# Patient Record
Sex: Female | Born: 1950 | Race: Black or African American | Hispanic: No | Marital: Married | Smoking: Current every day smoker
Health system: Southern US, Community
[De-identification: ages and names within clinical notes are randomized; demographics above are authoritative.]

## PROBLEM LIST (undated history)

## (undated) DIAGNOSIS — E785 Hyperlipidemia, unspecified: Secondary | ICD-10-CM

## (undated) DIAGNOSIS — C50919 Malignant neoplasm of unspecified site of unspecified female breast: Principal | ICD-10-CM

## (undated) DIAGNOSIS — E039 Hypothyroidism, unspecified: Secondary | ICD-10-CM

## (undated) DIAGNOSIS — M1990 Unspecified osteoarthritis, unspecified site: Secondary | ICD-10-CM

## (undated) DIAGNOSIS — Z972 Presence of dental prosthetic device (complete) (partial): Secondary | ICD-10-CM

## (undated) DIAGNOSIS — Z973 Presence of spectacles and contact lenses: Secondary | ICD-10-CM

## (undated) DIAGNOSIS — K08109 Complete loss of teeth, unspecified cause, unspecified class: Secondary | ICD-10-CM

## (undated) HISTORY — PX: TONSILLECTOMY: SUR1361

## (undated) HISTORY — DX: Malignant neoplasm of unspecified site of unspecified female breast: C50.919

---

## 1979-12-04 HISTORY — PX: ABDOMINAL HYSTERECTOMY: SHX81

## 1986-12-03 HISTORY — PX: APPENDECTOMY: SHX54

## 1988-12-03 HISTORY — PX: BREAST SURGERY: SHX581

## 1989-12-03 HISTORY — PX: BREAST CYST ASPIRATION: SHX578

## 2000-09-20 ENCOUNTER — Encounter: Payer: Self-pay | Admitting: Obstetrics

## 2000-09-20 ENCOUNTER — Ambulatory Visit (HOSPITAL_COMMUNITY): Admission: RE | Admit: 2000-09-20 | Discharge: 2000-09-20 | Payer: Self-pay | Admitting: Obstetrics

## 2003-08-29 ENCOUNTER — Encounter: Payer: Self-pay | Admitting: Emergency Medicine

## 2003-08-29 ENCOUNTER — Emergency Department (HOSPITAL_COMMUNITY): Admission: EM | Admit: 2003-08-29 | Discharge: 2003-08-29 | Payer: Self-pay

## 2003-09-29 ENCOUNTER — Encounter: Admission: RE | Admit: 2003-09-29 | Discharge: 2003-12-28 | Payer: Self-pay | Admitting: Orthopedic Surgery

## 2003-12-04 HISTORY — PX: SHOULDER ARTHROSCOPY: SHX128

## 2003-12-29 ENCOUNTER — Encounter: Admission: RE | Admit: 2003-12-29 | Discharge: 2004-03-28 | Payer: Self-pay | Admitting: Orthopedic Surgery

## 2004-03-18 ENCOUNTER — Ambulatory Visit (HOSPITAL_COMMUNITY): Admission: RE | Admit: 2004-03-18 | Discharge: 2004-03-18 | Payer: Self-pay | Admitting: Orthopedic Surgery

## 2004-03-29 ENCOUNTER — Encounter: Admission: RE | Admit: 2004-03-29 | Discharge: 2004-06-27 | Payer: Self-pay | Admitting: Orthopedic Surgery

## 2004-06-21 ENCOUNTER — Ambulatory Visit (HOSPITAL_COMMUNITY): Admission: RE | Admit: 2004-06-21 | Discharge: 2004-06-22 | Payer: Self-pay | Admitting: Orthopedic Surgery

## 2004-07-21 ENCOUNTER — Encounter: Admission: RE | Admit: 2004-07-21 | Discharge: 2004-10-19 | Payer: Self-pay | Admitting: Orthopedic Surgery

## 2004-10-20 ENCOUNTER — Encounter: Admission: RE | Admit: 2004-10-20 | Discharge: 2005-01-18 | Payer: Self-pay | Admitting: Orthopedic Surgery

## 2006-08-01 ENCOUNTER — Ambulatory Visit (HOSPITAL_COMMUNITY): Admission: RE | Admit: 2006-08-01 | Discharge: 2006-08-01 | Payer: Self-pay | Admitting: Pulmonary Disease

## 2006-12-03 HISTORY — PX: TOE FUSION: SHX1070

## 2007-07-07 ENCOUNTER — Encounter: Admission: RE | Admit: 2007-07-07 | Discharge: 2007-07-07 | Payer: Self-pay | Admitting: Orthopedic Surgery

## 2007-07-10 ENCOUNTER — Ambulatory Visit (HOSPITAL_COMMUNITY): Admission: RE | Admit: 2007-07-10 | Discharge: 2007-07-10 | Payer: Self-pay | Admitting: Orthopedic Surgery

## 2007-09-24 ENCOUNTER — Ambulatory Visit (HOSPITAL_COMMUNITY): Admission: RE | Admit: 2007-09-24 | Discharge: 2007-09-24 | Payer: Self-pay | Admitting: Pulmonary Disease

## 2009-12-03 HISTORY — PX: BREAST SURGERY: SHX581

## 2010-04-26 ENCOUNTER — Encounter: Admission: RE | Admit: 2010-04-26 | Discharge: 2010-04-26 | Payer: Self-pay | Admitting: Pulmonary Disease

## 2010-04-27 ENCOUNTER — Encounter: Admission: RE | Admit: 2010-04-27 | Discharge: 2010-04-27 | Payer: Self-pay | Admitting: Pulmonary Disease

## 2010-05-11 ENCOUNTER — Encounter: Admission: RE | Admit: 2010-05-11 | Discharge: 2010-05-11 | Payer: Self-pay | Admitting: Pulmonary Disease

## 2010-06-26 ENCOUNTER — Encounter: Admission: RE | Admit: 2010-06-26 | Discharge: 2010-06-26 | Payer: Self-pay | Admitting: Surgery

## 2010-06-28 ENCOUNTER — Ambulatory Visit (HOSPITAL_BASED_OUTPATIENT_CLINIC_OR_DEPARTMENT_OTHER): Admission: RE | Admit: 2010-06-28 | Discharge: 2010-06-28 | Payer: Self-pay | Admitting: Surgery

## 2010-06-28 ENCOUNTER — Encounter: Admission: RE | Admit: 2010-06-28 | Discharge: 2010-06-28 | Payer: Self-pay | Admitting: Surgery

## 2010-07-03 ENCOUNTER — Ambulatory Visit: Payer: Self-pay | Admitting: Oncology

## 2010-08-18 ENCOUNTER — Ambulatory Visit: Admission: RE | Admit: 2010-08-18 | Discharge: 2010-08-29 | Payer: Self-pay | Admitting: Radiation Oncology

## 2010-08-31 ENCOUNTER — Ambulatory Visit (HOSPITAL_BASED_OUTPATIENT_CLINIC_OR_DEPARTMENT_OTHER): Admission: RE | Admit: 2010-08-31 | Discharge: 2010-08-31 | Payer: Self-pay | Admitting: Surgery

## 2010-09-23 HISTORY — PX: MASTECTOMY: SHX3

## 2010-09-28 ENCOUNTER — Ambulatory Visit: Payer: Self-pay | Admitting: Oncology

## 2010-10-02 LAB — CBC WITH DIFFERENTIAL/PLATELET
BASO%: 0.6 % (ref 0.0–2.0)
Basophils Absolute: 0.1 10*3/uL (ref 0.0–0.1)
EOS%: 4.8 % (ref 0.0–7.0)
Eosinophils Absolute: 0.5 10*3/uL (ref 0.0–0.5)
HCT: 36.7 % (ref 34.8–46.6)
HGB: 12.1 g/dL (ref 11.6–15.9)
LYMPH%: 49.4 % (ref 14.0–49.7)
MCH: 30.2 pg (ref 25.1–34.0)
MCHC: 33 g/dL (ref 31.5–36.0)
MCV: 91.5 fL (ref 79.5–101.0)
MONO#: 0.9 10*3/uL (ref 0.1–0.9)
MONO%: 8.7 % (ref 0.0–14.0)
NEUT#: 3.8 10*3/uL (ref 1.5–6.5)
NEUT%: 36.5 % — ABNORMAL LOW (ref 38.4–76.8)
Platelets: 272 10*3/uL (ref 145–400)
RBC: 4.01 10*6/uL (ref 3.70–5.45)
RDW: 13.9 % (ref 11.2–14.5)
WBC: 10.5 10*3/uL — ABNORMAL HIGH (ref 3.9–10.3)
lymph#: 5.2 10*3/uL — ABNORMAL HIGH (ref 0.9–3.3)
nRBC: 0 % (ref 0–0)

## 2010-10-02 LAB — TECHNOLOGIST REVIEW

## 2010-11-29 ENCOUNTER — Ambulatory Visit: Payer: Self-pay | Admitting: Oncology

## 2010-12-03 HISTORY — PX: BREAST RECONSTRUCTION: SHX9

## 2010-12-05 ENCOUNTER — Encounter
Admission: RE | Admit: 2010-12-05 | Discharge: 2010-12-05 | Payer: Self-pay | Source: Home / Self Care | Attending: Oncology | Admitting: Oncology

## 2010-12-05 LAB — CBC WITH DIFFERENTIAL/PLATELET
BASO%: 0.2 % (ref 0.0–2.0)
Eosinophils Absolute: 0.2 10*3/uL (ref 0.0–0.5)
HCT: 34.9 % (ref 34.8–46.6)
LYMPH%: 45.6 % (ref 14.0–49.7)
MCH: 29.8 pg (ref 25.1–34.0)
MCHC: 33 g/dL (ref 31.5–36.0)
MCV: 90.4 fL (ref 79.5–101.0)
Platelets: 295 10*3/uL (ref 145–400)
lymph#: 3.9 10*3/uL — ABNORMAL HIGH (ref 0.9–3.3)

## 2010-12-05 LAB — FOLLICLE STIMULATING HORMONE: FSH: 56.2 m[IU]/mL

## 2010-12-05 LAB — COMPREHENSIVE METABOLIC PANEL
ALT: 13 U/L (ref 0–35)
AST: 19 U/L (ref 0–37)
Albumin: 4.1 g/dL (ref 3.5–5.2)
Alkaline Phosphatase: 114 U/L (ref 39–117)
BUN: 7 mg/dL (ref 6–23)
CO2: 25 mEq/L (ref 19–32)
Calcium: 10.2 mg/dL (ref 8.4–10.5)
Chloride: 106 mEq/L (ref 96–112)
Creatinine, Ser: 0.72 mg/dL (ref 0.40–1.20)
Glucose, Bld: 103 mg/dL — ABNORMAL HIGH (ref 70–99)
Potassium: 4 mEq/L (ref 3.5–5.3)
Sodium: 143 mEq/L (ref 135–145)
Total Bilirubin: 0.3 mg/dL (ref 0.3–1.2)
Total Protein: 6.6 g/dL (ref 6.0–8.3)

## 2010-12-05 LAB — LUTEINIZING HORMONE: LH: 26.5 m[IU]/mL

## 2010-12-10 LAB — ESTRADIOL, ULTRA SENS: Estradiol, Ultra Sensitive: 2 pg/mL

## 2010-12-12 IMAGING — CR DG CHEST 2V
2 series · 2 of 2 positions shown · non-contrast
Comparison: Chest x-ray of 07/08/2007

CLINICAL DATA: Out for right breast surgery, smoking history

CHEST - 2 VIEW

[w chest pa]
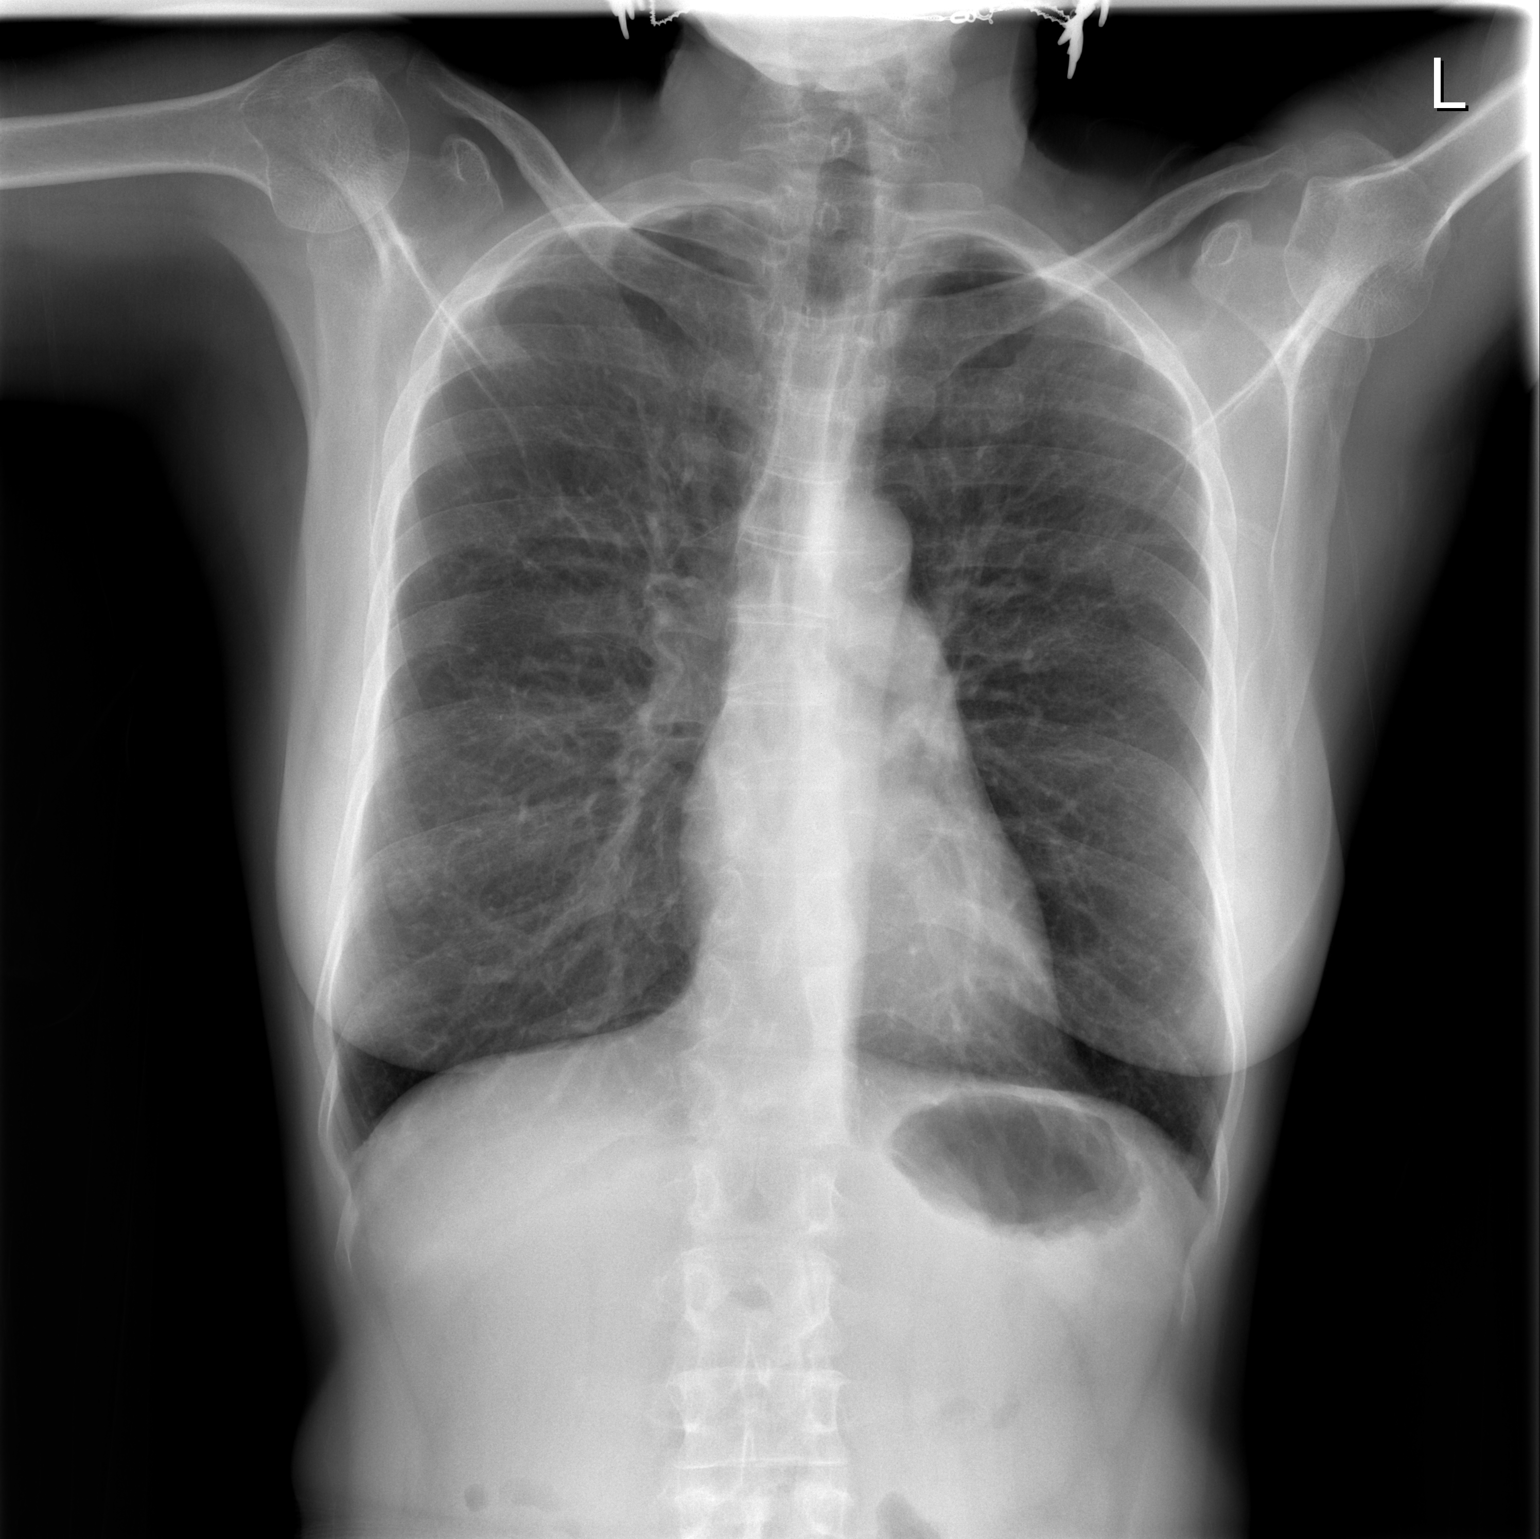

[w chest lat]
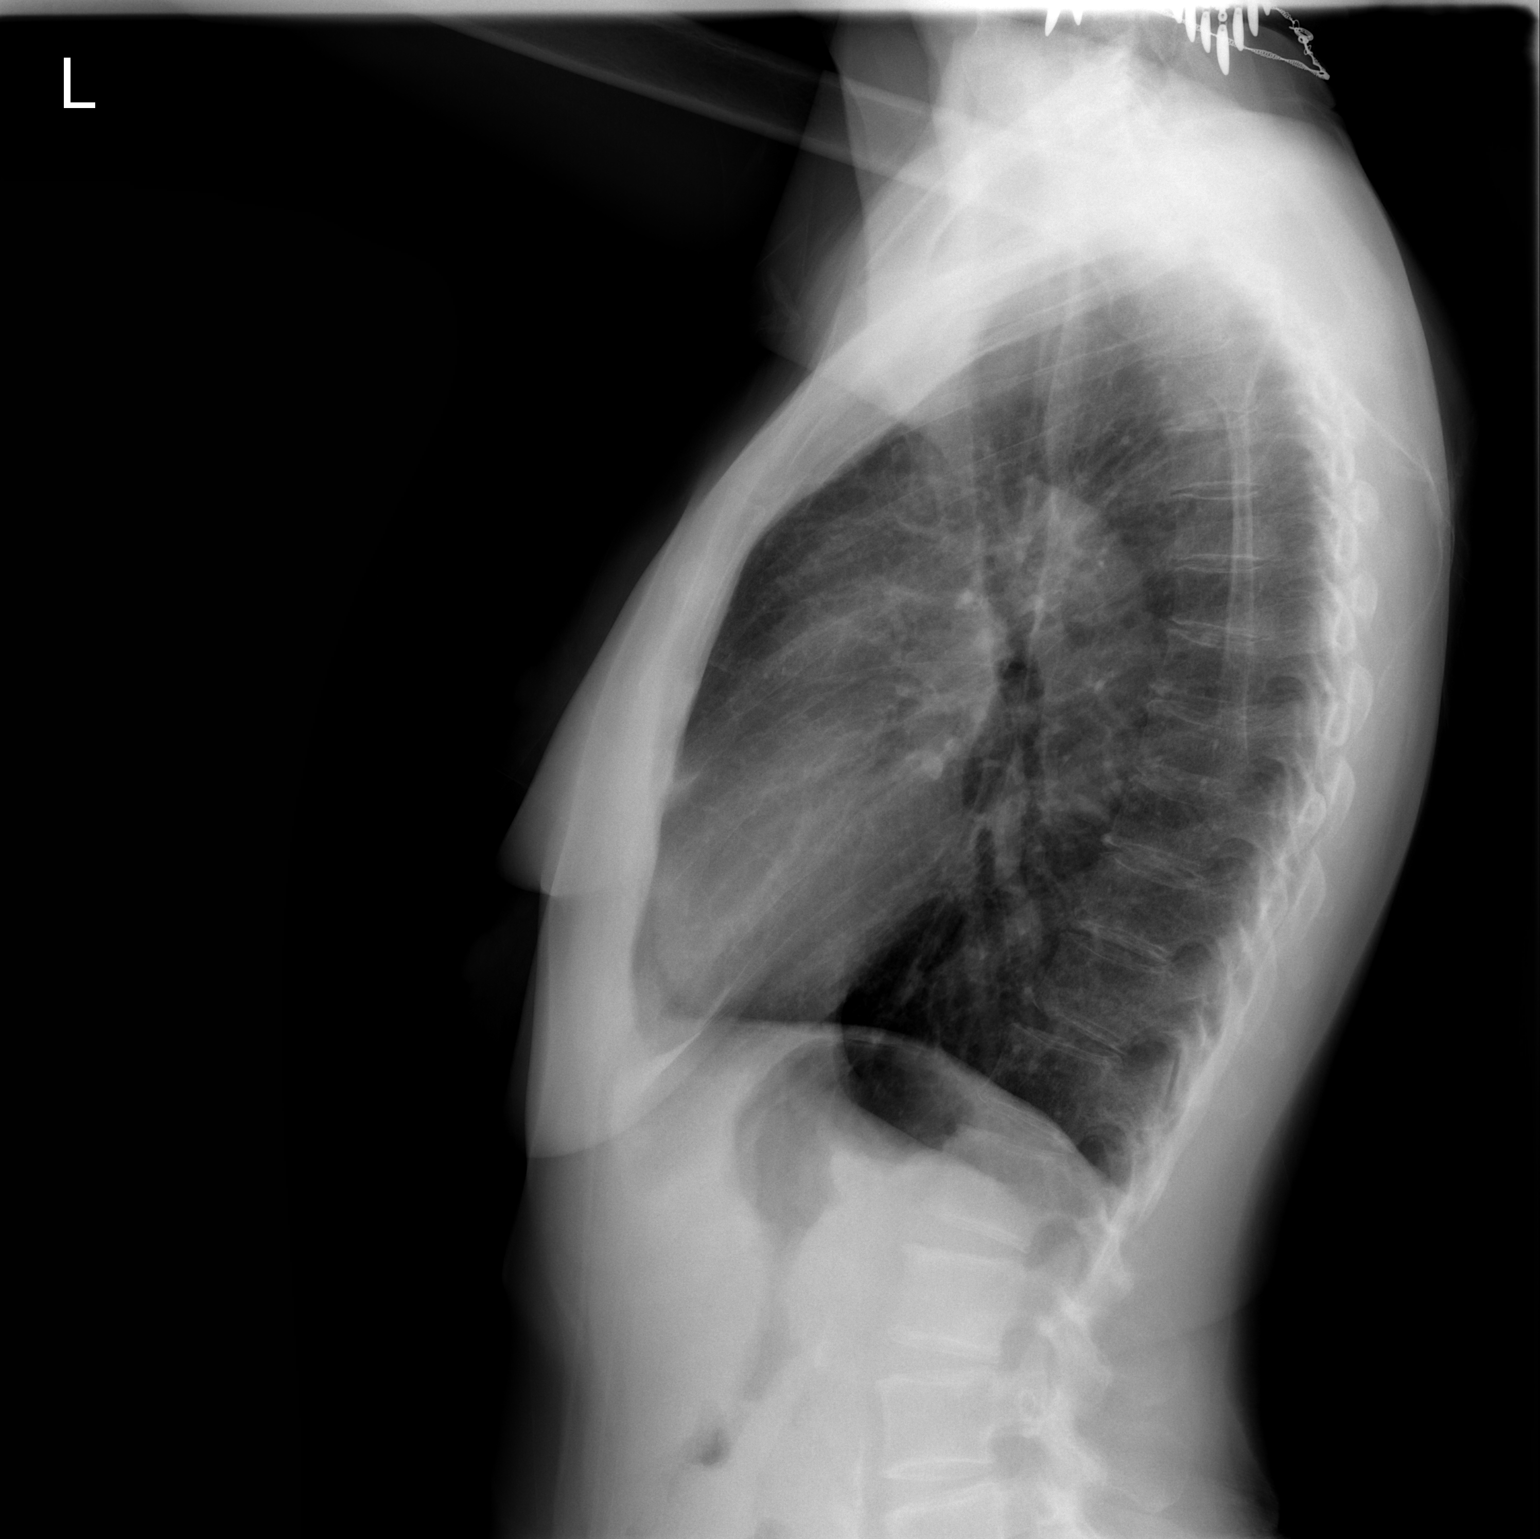

[2 of 2 positions shown; findings below may reference images not displayed]

FINDINGS: The lungs are clear and slightly hyperaerated.
Mediastinal contours appear stable.  Slightly prominent
interstitial markings appear stable.  The heart is within normal
limits in size.  No bony abnormality is seen.
IMPRESSION: Slight hyperaeration and probable chronic mild interstitial change.
No active lung disease.

## 2010-12-24 ENCOUNTER — Encounter: Payer: Self-pay | Admitting: Pulmonary Disease

## 2011-02-17 LAB — DIFFERENTIAL
Basophils Absolute: 0 10*3/uL (ref 0.0–0.1)
Basophils Relative: 0 % (ref 0–1)
Eosinophils Absolute: 0.2 10*3/uL (ref 0.0–0.7)
Eosinophils Relative: 2 % (ref 0–5)
Lymphocytes Relative: 33 % (ref 12–46)
Neutro Abs: 6.6 10*3/uL (ref 1.7–7.7)

## 2011-02-17 LAB — CBC
MCV: 95.2 fL (ref 78.0–100.0)
Platelets: 336 10*3/uL (ref 150–400)
RBC: 4.17 MIL/uL (ref 3.87–5.11)
WBC: 11.4 10*3/uL — ABNORMAL HIGH (ref 4.0–10.5)

## 2011-02-17 LAB — BASIC METABOLIC PANEL
BUN: 6 mg/dL (ref 6–23)
GFR calc Af Amer: >60 mL/min (ref >60)
Sodium: 141 mEq/L (ref 135–145)

## 2011-04-17 NOTE — Op Note (Signed)
NAMEKYNLEY, METZGER NO.:  [id number]   MEDICAL RECORD NO.:  [id number]          PATIENT TYPE:  AMB   LOCATION:  SDS                          FACILITY:  [facility]   PHYSICIAN:  Myrtie Neither, MD      DATE OF BIRTH:  [birth year]   DATE OF PROCEDURE:  07/10/2007  DATE OF DISCHARGE:                               OPERATIVE REPORT   PREOPERATIVE DIAGNOSIS:  Dorsal callosity osteophyte right fifth toe.   POSTOPERATIVE DIAGNOSIS:  Dorsal callosity osteophyte right fifth toe.   ANESTHESIA:  Ankle block.   PROCEDURE:  Phalangectomy, right fifth toe.   The patient was taken to the operating room after given adequate preop  medication.  The patient was given a right ankle block.  Right foot and  lower leg was prepped with DuraPrep and draped in a sterile manner.  Tourniquet used for hemostasis.  Dorsal incision made over the right  fifth toe going through the skin and subcutaneous tissue.  Sharp and  blunt dissection was made down over the proximal phalanx.  Bone cutter  was used to dissect the proximal phalanx of the fifth toe.  After  complete resection, irrigation was then done.  Wound closure was then  done with 4-0 nylon.  Bulky compressive dressing was applied.  The  patient tolerated procedure quite well and went to the recovery room in  stable and satisfactory position.  The patient is being discharged home  with use of fracture shoe, ice packs, elevation, crutches for partial  weightbearing on the right side and return to the office in one week.  The patient being discharged in stable and satisfactory condition.      Myrtie Neither, MD  Electronically Signed     AC/MEDQ  D:  07/10/2007  T:  07/10/2007  Job:  [id number]

## 2011-04-20 NOTE — H&P (Signed)
NAME:  Carrie Ramirez, Carrie Ramirez NO.:  [id number]   MEDICAL RECORD NO.:  [id number]                   PATIENT TYPE:  OIB   LOCATION:  2899                                 FACILITY:  [facility]   PHYSICIAN:  Myrtie Neither, M.D.                 DATE OF BIRTH:  Oct 08, 1951   DATE OF ADMISSION:  06/21/2004  DATE OF DISCHARGE:                                HISTORY & PHYSICAL   CHIEF COMPLAINT:  Painful right shoulder.   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  The patient had been treated with prednisone as well as  therapeutic injections to the shoulder, physical therapy.  The patient has  had improvement in range of motion of the right shoulder, but has pain on  abduction of the right shoulder and still persistent stiffness.  The patient  had MRI done demonstrating entrapment of the rotator cuff of the right  shoulder and rotator cuff injury.   PAST MEDICAL HISTORY:  1. Tonsillectomy.  2. D&C.  3. Hysterectomy.  4. Appendectomy.  5. Lithotripsy.  6. Shoulder-hand syndrome.  7. Sinus infection.  8. Hypoglycemia.  9. Hypothyroidism.   ALLERGIES:  1. SULFA.  2. ASPIRIN.   MEDICATIONS:  1. Prednisone 10 mg daily.  2. Cipro 500 mg b.i.d.  3. Synthroid 10 mEq daily.  4. Duratuss 60/120 1/2 tablet b.i.d.  5. Vitamin E daily.  6. Albuterol inhaler.  7. Lipitor 10 mg daily.   SOCIAL HISTORY:  The patient is a chronic smoker, 1/2 pack per day.  No use  of alcohol.   REVIEW OF SYSTEMS:  Basically, as in history of present illness.  Also,  productive cough for the past week.  Residual of shoulder-hand syndrome,  right upper extremity.  No urinary or bowel symptoms.   FAMILY HISTORY:  Noncontributory.   PHYSICAL EXAMINATION:  GENERAL:  Alert and oriented, in no  acute distress.  HEENT:  Head is normocephalic.  Eyes:  Sclerae clear.  NECK:  Supple.  CHEST:  Some rhonchi.  CARDIAC:  S1 and S2 regular.  EXTREMITIES:  Right shoulder ankylotic, tender subacromial space with  subacromial crepitus.  Passive abduction right shoulder 75 degrees,  limitation on external rotation to neutral position.  Right hand:  Good  grip, good pinch, intrinsic's intact the distance of the MCP and IP joint of  the fingers of the right hand.  Wrist range of motion:  The patient lacks  full dorsal flexion, supination and pronation are good.   IMPRESSION:  1. Impingement syndrome, right shoulder.  2. Rotator cuff injury, right shoulder.   PLAN:  Arthroscopic acromioplasty and possible rotator cuff repair.  Myrtie Neither, M.D.    AC/MEDQ  D:  06/21/2004  T:  06/21/2004  Job:  [id number]

## 2011-05-17 ENCOUNTER — Encounter (HOSPITAL_BASED_OUTPATIENT_CLINIC_OR_DEPARTMENT_OTHER)
Admission: RE | Admit: 2011-05-17 | Discharge: 2011-05-17 | Disposition: A | Discharge status: Home / Self Care | Payer: BC Managed Care – PPO | Source: Ambulatory Visit | Attending: Specialist | Admitting: Specialist

## 2011-05-17 LAB — CBC
Hemoglobin: 13.6 g/dL (ref 12.0–15.0)
MCH: 30.8 pg (ref 26.0–34.0)
MCHC: 34.4 g/dL (ref 30.0–36.0)
RDW: 13.6 % (ref 11.5–15.5)

## 2011-05-17 LAB — BASIC METABOLIC PANEL
BUN: 11 mg/dL (ref 6–23)
Calcium: 9.5 mg/dL (ref 8.4–10.5)
GFR calc Af Amer: >60 mL/min (ref >60)
GFR calc non Af Amer: >60 mL/min (ref >60)
Glucose, Bld: 81 mg/dL (ref 70–99)
Sodium: 140 mEq/L (ref 135–145)

## 2011-05-17 LAB — DIFFERENTIAL
Basophils Absolute: 0 10*3/uL (ref 0.0–0.1)
Basophils Relative: 0 % (ref 0–1)
Eosinophils Relative: 4 % (ref 0–5)

## 2011-05-21 ENCOUNTER — Ambulatory Visit (HOSPITAL_BASED_OUTPATIENT_CLINIC_OR_DEPARTMENT_OTHER)
Admission: RE | Admit: 2011-05-21 | Discharge: 2011-05-21 | Disposition: A | Discharge status: Home / Self Care | Payer: BC Managed Care – PPO | Source: Ambulatory Visit | Attending: Specialist | Admitting: Specialist

## 2011-05-21 ENCOUNTER — Other Ambulatory Visit: Payer: Self-pay | Admitting: Specialist

## 2011-05-21 DIAGNOSIS — F17200 Nicotine dependence, unspecified, uncomplicated: Secondary | ICD-10-CM | POA: Insufficient documentation

## 2011-05-21 DIAGNOSIS — E039 Hypothyroidism, unspecified: Secondary | ICD-10-CM | POA: Insufficient documentation

## 2011-05-21 DIAGNOSIS — Z79899 Other long term (current) drug therapy: Secondary | ICD-10-CM | POA: Insufficient documentation

## 2011-05-21 DIAGNOSIS — C50919 Malignant neoplasm of unspecified site of unspecified female breast: Secondary | ICD-10-CM | POA: Insufficient documentation

## 2011-05-21 DIAGNOSIS — Z01812 Encounter for preprocedural laboratory examination: Secondary | ICD-10-CM | POA: Insufficient documentation

## 2011-05-21 DIAGNOSIS — N651 Disproportion of reconstructed breast: Secondary | ICD-10-CM | POA: Insufficient documentation

## 2011-05-21 DIAGNOSIS — L905 Scar conditions and fibrosis of skin: Secondary | ICD-10-CM | POA: Insufficient documentation

## 2011-06-19 ENCOUNTER — Other Ambulatory Visit: Payer: Self-pay | Admitting: Oncology

## 2011-06-19 ENCOUNTER — Encounter (HOSPITAL_BASED_OUTPATIENT_CLINIC_OR_DEPARTMENT_OTHER): Payer: BC Managed Care – PPO | Admitting: Oncology

## 2011-06-19 DIAGNOSIS — Z170 Estrogen receptor positive status [ER+]: Secondary | ICD-10-CM

## 2011-06-19 DIAGNOSIS — C50919 Malignant neoplasm of unspecified site of unspecified female breast: Secondary | ICD-10-CM

## 2011-06-19 DIAGNOSIS — C50219 Malignant neoplasm of upper-inner quadrant of unspecified female breast: Secondary | ICD-10-CM

## 2011-06-19 LAB — COMPREHENSIVE METABOLIC PANEL
ALT: 12 U/L (ref 0–35)
AST: 21 U/L (ref 0–37)
Albumin: 3.7 g/dL (ref 3.5–5.2)
Alkaline Phosphatase: 100 U/L (ref 39–117)
Potassium: 4 mEq/L (ref 3.5–5.3)
Sodium: 142 mEq/L (ref 135–145)
Total Protein: 7 g/dL (ref 6.0–8.3)

## 2011-06-19 LAB — CBC WITH DIFFERENTIAL/PLATELET
EOS%: 4.4 % (ref 0.0–7.0)
MCH: 30.9 pg (ref 25.1–34.0)
MCV: 91.6 fL (ref 79.5–101.0)
MONO%: 8.7 % (ref 0.0–14.0)
NEUT#: 4.4 10*3/uL (ref 1.5–6.5)
RBC: 3.97 10*6/uL (ref 3.70–5.45)
RDW: 14.1 % (ref 11.2–14.5)

## 2011-06-26 ENCOUNTER — Encounter (HOSPITAL_BASED_OUTPATIENT_CLINIC_OR_DEPARTMENT_OTHER): Payer: BC Managed Care – PPO | Admitting: Oncology

## 2011-06-26 DIAGNOSIS — C50219 Malignant neoplasm of upper-inner quadrant of unspecified female breast: Secondary | ICD-10-CM

## 2011-06-26 DIAGNOSIS — Z170 Estrogen receptor positive status [ER+]: Secondary | ICD-10-CM

## 2011-08-04 HISTORY — PX: AUGMENTATION MAMMAPLASTY: SUR837

## 2011-09-17 LAB — BASIC METABOLIC PANEL
CO2: 30
Calcium: 10
Creatinine, Ser: 0.74
Glucose, Bld: 73

## 2011-09-17 LAB — URINALYSIS, ROUTINE W REFLEX MICROSCOPIC
Glucose, UA: NEGATIVE
Nitrite: NEGATIVE
Protein, ur: NEGATIVE
Urobilinogen, UA: 1

## 2011-09-17 LAB — CBC
Hemoglobin: 13.6
MCHC: 33.5
RDW: 13.3

## 2011-09-17 LAB — HEPATIC FUNCTION PANEL
Albumin: 3.9
Total Bilirubin: 0.5
Total Protein: 6.6

## 2011-09-17 LAB — URINE MICROSCOPIC-ADD ON

## 2011-12-20 ENCOUNTER — Other Ambulatory Visit: Payer: BC Managed Care – PPO | Admitting: Lab

## 2011-12-25 ENCOUNTER — Other Ambulatory Visit: Payer: BC Managed Care – PPO | Admitting: Lab

## 2011-12-25 DIAGNOSIS — C50219 Malignant neoplasm of upper-inner quadrant of unspecified female breast: Secondary | ICD-10-CM | POA: Diagnosis not present

## 2011-12-25 LAB — CBC WITH DIFFERENTIAL/PLATELET
BASO%: 0.6 % (ref 0.0–2.0)
EOS%: 4.1 % (ref 0.0–7.0)
HCT: 38.4 % (ref 34.8–46.6)
LYMPH%: 50.9 % — ABNORMAL HIGH (ref 14.0–49.7)
MCH: 31.5 pg (ref 25.1–34.0)
MCHC: 33.7 g/dL (ref 31.5–36.0)
MCV: 93.6 fL (ref 79.5–101.0)
MONO%: 6.6 % (ref 0.0–14.0)
NEUT%: 37.8 % — ABNORMAL LOW (ref 38.4–76.8)
Platelets: 226 10*3/uL (ref 145–400)

## 2011-12-26 LAB — COMPREHENSIVE METABOLIC PANEL
ALT: 11 U/L (ref 0–35)
AST: 25 U/L (ref 0–37)
BUN: 9 mg/dL (ref 6–23)
Creatinine, Ser: 0.84 mg/dL (ref 0.50–1.10)
Total Bilirubin: 0.4 mg/dL (ref 0.3–1.2)

## 2011-12-27 ENCOUNTER — Ambulatory Visit: Payer: BC Managed Care – PPO | Admitting: Oncology

## 2011-12-28 ENCOUNTER — Encounter: Payer: Self-pay | Admitting: Physician Assistant

## 2011-12-28 ENCOUNTER — Ambulatory Visit: Payer: BC Managed Care – PPO | Admitting: Physician Assistant

## 2011-12-28 DIAGNOSIS — C50919 Malignant neoplasm of unspecified site of unspecified female breast: Secondary | ICD-10-CM

## 2011-12-28 HISTORY — DX: Malignant neoplasm of unspecified site of unspecified female breast: C50.919

## 2011-12-28 NOTE — Progress Notes (Signed)
Patient has requested today's appointment, 12/28/2011, be canceled due to inclement weather. This has been rescheduled for January 30 at 1:15.

## 2012-01-02 ENCOUNTER — Encounter (HOSPITAL_BASED_OUTPATIENT_CLINIC_OR_DEPARTMENT_OTHER): Payer: Self-pay | Admitting: *Deleted

## 2012-01-02 ENCOUNTER — Encounter: Payer: Self-pay | Admitting: Physician Assistant

## 2012-01-02 ENCOUNTER — Ambulatory Visit (HOSPITAL_BASED_OUTPATIENT_CLINIC_OR_DEPARTMENT_OTHER): Payer: BC Managed Care – PPO | Admitting: Physician Assistant

## 2012-01-02 ENCOUNTER — Ambulatory Visit: Payer: BC Managed Care – PPO | Admitting: Oncology

## 2012-01-02 VITALS — BP 158/65 | HR 118 | Temp 98.6°F | Wt 106.3 lb

## 2012-01-02 DIAGNOSIS — C50919 Malignant neoplasm of unspecified site of unspecified female breast: Secondary | ICD-10-CM | POA: Diagnosis not present

## 2012-01-02 DIAGNOSIS — M949 Disorder of cartilage, unspecified: Secondary | ICD-10-CM

## 2012-01-02 DIAGNOSIS — M899 Disorder of bone, unspecified: Secondary | ICD-10-CM | POA: Diagnosis not present

## 2012-01-02 DIAGNOSIS — C50419 Malignant neoplasm of upper-outer quadrant of unspecified female breast: Secondary | ICD-10-CM | POA: Diagnosis not present

## 2012-01-02 DIAGNOSIS — M8580 Other specified disorders of bone density and structure, unspecified site: Secondary | ICD-10-CM | POA: Insufficient documentation

## 2012-01-02 MED ORDER — ALENDRONATE SODIUM 70 MG PO TABS: 70.0000 mg | ORAL_TABLET | ORAL | Status: DC

## 2012-01-02 MED ORDER — LETROZOLE 2.5 MG PO TABS: 2.5000 mg | ORAL_TABLET | Freq: Every day | ORAL | Status: DC

## 2012-01-02 NOTE — Progress Notes (Signed)
Hematology and Oncology Follow Up Visit  Carrie Ramirez [id number] 1951-02-11 60 y.o. 01/02/2012    HPI: Carrie Ramirez palpated a mass in her right breast on Mother's Day.  She notes that she missed her mammogram last year because her husband had some surgery and in fact the last mammogram I can find for her would be from October of 2008.  At any rate she brought the mass to Dr. Consuello Bossier attention and he set her up for bilateral diagnostic mammography performed Apr 26, 2010.  This found pleomorphic and linear classifications in the upper inner right breast, measuring up to 6.5 cm.  The posterior extent of the calcifications were outside of the field of view.  No discrete mass was seen, although on physical exam Dr. Si Gaul was able to palpate some thickening in the area in question.  Ultrasound showed an ill-defined hypoechoic areas at the corresponding portion of the breast.  There were no enlarged or abnormal appearing right axillary lymph nodes identified.  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  With this information the patient was referred to Dr. Luisa Hart and bilateral breast MRIs were obtained June 9.  This showed an area of asymmetric patchy enhancement in the right breast surrounding the clip artifact and measuring approximately 2.3 cm.  There was no abnormal enhancement in the left breast and no enlarged axillary or internal mammary lymph nodes.  Accordingly, after appropriate discussion, the patient proceeded to needle localized lumpectomy without sentinel lymph node sampling June 28, 2010.  The pathology from this procedure ([id number]) showed in addition to extensive high grade ductal carcinoma in situ, multiple foci of invasive ductal carcinoma, grade 2.  The largest focus measured 8 mm.  The anterior and superior margins were  involved with DCIS.  The distance to the invasive component was less than a millimeter anteriorly.  She underwent modified radical mastectomy September 29th under Dr. Luisa Hart.  The final pathology [id number]) showed no residual invasive tumor in the breast (there was a single focus of high-grade ductal carcinoma in situ with ample margins), but the tumor did involve both sentinel lymph nodes.  An additional 15 lymph nodes were negative, giving her a total of 17 lymph nodes of which 2 were positive.    The Adjuvant! Program would quote her a risk of recurrence with local treatment only of 35%. She could cut this in half by taking antiestrogens for 5-10 years which was recommended.  She could cut this down an additional 4% by taking 5 months of aggressive chemotherapy.  This was not a motivator for her, since she understands 96% of women like her would get no benefit from the very toxic chemotherapy drugs.    Accordingly, the patient began on letrozole adjuvantly in October of 2011. She continues to 2.5 mg daily. Also has a history of osteopenia and is on Fosamax weekly.  Interim History:   Carrie Ramirez returns today accompanied by her husband for routine followup of her right breast carcinoma. Interval history is remarkable for her right implant having a "leak". This was evaluated by Dr. Shon Hough last week, and in fact she scheduled for surgery to correct this issue next Monday, February 4.  Otherwise,  Carrie Ramirez has been doing very well and had few complaints today. She continues on both letrozole and Fosamax and tolerates both drugs well. She has no increased reflux.  No problems with hot flashes. No increased joint pain. No problems with vaginal dryness.  A detailed review of systems is otherwise noncontributory as noted below.  Review of Systems: Constitutional:  no weight loss, fever, night sweats and feels well Eyes: negative WUJ:[id number] Cardiovascular: no chest pain or dyspnea on  exertion Respiratory: no cough, shortness of breath, or wheezing Neurological: no TIA or stroke symptoms negative Dermatological: negative Gastrointestinal: no abdominal pain, change in bowel habits, or black or bloody stools Genito-Urinary: no dysuria, trouble voiding, or hematuria Hematological and Lymphatic: negative Breast: positive for - "leaking" implant on right Musculoskeletal: negative Remaining ROS negative.  PAST MEDICAL HISTORY:   1.  History of ongoing tobacco abuse approximately half a pack per day. 2.  Remote history of peptic ulcer disease. 3.  Hypothyroidism. 4.  History of right foot surgery. 5.  History of right shoulder rotator cuff surgery under Dr. Montez Morita. 6.  Status post tonsillectomy and adenoidectomy. 7.  Status post appendectomy. 8.  Status post simple hysterectomy.  FAMILY HISTORY:  The patient's father died in his early 42s from complications of diabetes.  The patient's mother died at the age of 82.  She had a diagnosis of mesothelioma.  The patient has one sister who is in good health.  There is no history of breast or ovarian cancer in the immediate family although a cousin on the patient's father side did have breast cancer.  GYNECOLOGIC HISTORY:  She is Gx, P3.  As stated she had her hysterectomy in 1981.  She took Premarin after that for several years and more recently she has been on the estrogen patch.  SOCIAL HISTORY:  She used to work in [employer] at [employer], but is currently disabled because of nerve issues relating to her right arm.  These were only partially corrected by the rotator cuff surgery she says.  Her husband of 31 years, Scotty Pinder (goes by "[name]") is Herbalist at [employer].  The patient's daughter [name] is an associate at [employer].  Daughter [name] 30 is a Tree surgeon, son [name] is attending cooking school hoping to become a Investment banker, operational.  The patient has six grandchildren and is also raising two nephews.   She is a member of the new [employer].  Medications:   I have reviewed the patient's current medications.  Current Outpatient Prescriptions  Medication Sig Dispense Refill  . acetaminophen (TYLENOL) 325 MG tablet Take 650 mg by mouth every 6 (six) hours as needed.      Marland Kitchen alendronate (FOSAMAX) 70 MG tablet Take 1 tablet (70 mg total) by mouth every 7 (seven) days. Take with a full glass of water on an empty stomach.  4 tablet  12  . calcium carbonate (OS-CAL) 600 MG TABS Take 600 mg by mouth 2 (two) times daily with a meal.      . cholecalciferol (VITAMIN D) 400 UNITS TABS Take 1,000 Units by mouth daily.      Marland Kitchen letrozole (FEMARA) 2.5 MG tablet Take 1 tablet (2.5 mg total) by mouth daily.  30 tablet  12  . levothyroxine (SYNTHROID, LEVOTHROID) 112 MCG tablet Take 112 mcg by mouth daily.      . polycarbophil (FIBERCON) 625 MG tablet Take 625 mg by mouth daily.      . rosuvastatin (CRESTOR) 10 MG tablet Take 10 mg by mouth daily.        Allergies:  Allergies  Allergen Reactions  . Asa Arthritis Strength-Antacid (Aspirin Buffered) Nausea Only  .  Sulfa Antibiotics Nausea Only    Physical Exam: Filed Vitals:   01/02/12 1347  BP: 158/65  Pulse: 118  Temp: 98.6 F (37 C)   HEENT:  Sclerae anicteric, conjunctivae pink.  Oropharynx clear.  No mucositis or candidiasis.   Nodes:  No cervical, supraclavicular, or axillary lymphadenopathy palpated.  Breast Exam:  Patient is status post right mastectomy with implant reconstruction, implant appears "deflated" today. No suspicious nodularity, skin changes, or evidence of local recurrence of disease. Left breast status post mastopexy, well-healed incisions, with no nodularity, skin changes, or nipple inversion.   Lungs:  Clear to auscultation bilaterally.  No crackles, rhonchi, or wheezes.   Heart:  Regular rate and rhythm.   Abdomen:  Soft, nontender.  Positive bowel sounds.  No organomegaly or masses palpated.   Musculoskeletal:   No focal spinal tenderness to palpation.  Extremities:  Benign.  No peripheral edema or cyanosis.   Skin:  Benign.   Neuro:  Nonfocal.   Lab Results: Lab Results  Component Value Date   WBC 9.1 12/25/2011   HGB 12.9 12/25/2011   HCT 38.4 12/25/2011   MCV 93.6 12/25/2011   PLT 226 12/25/2011   NEUTROABS 3.5 12/25/2011     Chemistry      Component Value Date/Time   NA 143 12/25/2011 1429   K 4.3 12/25/2011 1429   CL 107 12/25/2011 1429   CO2 25 12/25/2011 1429   BUN 9 12/25/2011 1429   CREATININE 0.84 12/25/2011 1429      Component Value Date/Time   CALCIUM 9.5 12/25/2011 1429   ALKPHOS 87 12/25/2011 1429   AST 25 12/25/2011 1429   ALT 11 12/25/2011 1429   BILITOT 0.4 12/25/2011 1429        Radiological Studies:  Most recent bone density was in January 2012 showing severe osteopenia. Patient is due for her left mammogram.   Assessment:  A 61 year old [place] woman   (1)  status post right lumpectomy in July 2011 for multifocal invasive ductal carcinoma, grade 2, the largest focus measuring 8 mm.  Margins were positive for ductal carcinoma in situ, invasive tumor was estrogen receptor positive at 96%, progesterone receptor positive at 93%, HER2/neu negative, with MIB-1 of 13%.    (2)  Status post right modified radical mastectomy, now with implant reconstruction.  Mastectomy was in September 2011 and showed no residual invasive disease, 2 of 17 lymph nodes involved, for a final stage of T1b N1, or stage IIA.  Oncotype DX recurrence score of 18.   (3)   Began on letrozole adjuvantly in October of 2011 and continues at 2.5 mg daily.    (4)  Also with a history of osteopenia, on Fosamax weekly.   Plan:  With regards to her breast cancer, Carrie Ramirez is doing well, with no clinical evidence of disease recurrence today. She'll have her implant affected by Dr. Shon Hough as scheduled next week. We're going to go ahead and schedule her for her left diagnostic mammogram which is a little past  due. She will return to see [place] in 6 months, and in the meanwhile will continue on letrozole which she is tolerating well. She'll also continue on Fosamax. I have refilled both of these medications accordingly.  This plan was reviewed with the patient, who voices understanding and agreement.  She knows to call with any changes or problems.    Carrie Steinmetz, PA-C 01/02/2012

## 2012-01-03 ENCOUNTER — Encounter (HOSPITAL_BASED_OUTPATIENT_CLINIC_OR_DEPARTMENT_OTHER): Payer: Self-pay | Admitting: *Deleted

## 2012-01-03 ENCOUNTER — Telehealth: Payer: Self-pay | Admitting: Oncology

## 2012-01-03 NOTE — Telephone Encounter (Signed)
S/w the pt regarding her mammo appt and her march appts

## 2012-01-03 NOTE — Progress Notes (Signed)
No labs ordered by dr Margarette Asal needed per anesth Has been here 3x

## 2012-01-07 ENCOUNTER — Ambulatory Visit (HOSPITAL_BASED_OUTPATIENT_CLINIC_OR_DEPARTMENT_OTHER)
Admission: RE | Admit: 2012-01-07 | Discharge: 2012-01-07 | Disposition: A | Discharge status: Home / Self Care | Payer: BC Managed Care – PPO | Source: Ambulatory Visit | Attending: Specialist | Admitting: Specialist

## 2012-01-07 ENCOUNTER — Encounter (HOSPITAL_BASED_OUTPATIENT_CLINIC_OR_DEPARTMENT_OTHER)
Admission: RE | Disposition: A | Discharge status: Home / Self Care | Payer: Self-pay | Source: Ambulatory Visit | Attending: Specialist

## 2012-01-07 ENCOUNTER — Other Ambulatory Visit: Payer: Self-pay | Admitting: Specialist

## 2012-01-07 ENCOUNTER — Encounter (HOSPITAL_BASED_OUTPATIENT_CLINIC_OR_DEPARTMENT_OTHER): Payer: Self-pay | Admitting: Anesthesiology

## 2012-01-07 ENCOUNTER — Encounter (HOSPITAL_BASED_OUTPATIENT_CLINIC_OR_DEPARTMENT_OTHER): Payer: Self-pay | Admitting: *Deleted

## 2012-01-07 ENCOUNTER — Ambulatory Visit (HOSPITAL_BASED_OUTPATIENT_CLINIC_OR_DEPARTMENT_OTHER): Payer: BC Managed Care – PPO | Admitting: Anesthesiology

## 2012-01-07 DIAGNOSIS — J45909 Unspecified asthma, uncomplicated: Secondary | ICD-10-CM | POA: Insufficient documentation

## 2012-01-07 DIAGNOSIS — T85898A Other specified complication of other internal prosthetic devices, implants and grafts, initial encounter: Secondary | ICD-10-CM | POA: Diagnosis not present

## 2012-01-07 DIAGNOSIS — T8549XA Other mechanical complication of breast prosthesis and implant, initial encounter: Secondary | ICD-10-CM | POA: Diagnosis not present

## 2012-01-07 DIAGNOSIS — Z4430 Encounter for fitting and adjustment of external breast prosthesis, unspecified breast: Secondary | ICD-10-CM | POA: Insufficient documentation

## 2012-01-07 DIAGNOSIS — C50219 Malignant neoplasm of upper-inner quadrant of unspecified female breast: Secondary | ICD-10-CM | POA: Diagnosis not present

## 2012-01-07 HISTORY — DX: Unspecified osteoarthritis, unspecified site: M19.90

## 2012-01-07 HISTORY — DX: Hypothyroidism, unspecified: E03.9

## 2012-01-07 HISTORY — PX: BREAST RECONSTRUCTION: SHX9

## 2012-01-07 HISTORY — DX: Hyperlipidemia, unspecified: E78.5

## 2012-01-07 LAB — POCT HEMOGLOBIN-HEMACUE: Hemoglobin: 12.9 g/dL (ref 12.0–15.0)

## 2012-01-07 SURGERY — RECONSTRUCTION, BREAST
Anesthesia: General | Site: Breast | Laterality: Right | Wound class: Clean

## 2012-01-07 MED ORDER — MIDAZOLAM HCL 2 MG/2ML IJ SOLN: 0.5000 mg | INTRAMUSCULAR | Status: DC | PRN

## 2012-01-07 MED ORDER — MIDAZOLAM HCL 5 MG/5ML IJ SOLN
INTRAMUSCULAR | Status: DC | PRN
  Administered 2012-01-07: 2 mg via INTRAVENOUS

## 2012-01-07 MED ORDER — LIDOCAINE HCL (CARDIAC) 20 MG/ML IV SOLN
INTRAVENOUS | Status: DC | PRN
  Administered 2012-01-07: 50 mg via INTRAVENOUS

## 2012-01-07 MED ORDER — LIDOCAINE-EPINEPHRINE 0.5-1:200000 % IJ SOLN
INTRAMUSCULAR | Status: DC | PRN
  Administered 2012-01-07: 50 mL

## 2012-01-07 MED ORDER — DEXAMETHASONE SODIUM PHOSPHATE 4 MG/ML IJ SOLN
INTRAMUSCULAR | Status: DC | PRN
  Administered 2012-01-07: 4 mg via INTRAVENOUS

## 2012-01-07 MED ORDER — METOCLOPRAMIDE HCL 5 MG/ML IJ SOLN: 10.0000 mg | Freq: Once | INTRAMUSCULAR | Status: DC | PRN

## 2012-01-07 MED ORDER — LACTATED RINGERS IV SOLN
INTRAVENOUS | Status: DC
  Administered 2012-01-07 (×2): via INTRAVENOUS

## 2012-01-07 MED ORDER — FENTANYL CITRATE 0.05 MG/ML IJ SOLN: 25.0000 ug | INTRAMUSCULAR | Status: DC | PRN

## 2012-01-07 MED ORDER — ONDANSETRON HCL 4 MG/2ML IJ SOLN
INTRAMUSCULAR | Status: DC | PRN
  Administered 2012-01-07: 4 mg via INTRAVENOUS

## 2012-01-07 MED ORDER — SUCCINYLCHOLINE CHLORIDE 20 MG/ML IJ SOLN
INTRAMUSCULAR | Status: DC | PRN
  Administered 2012-01-07: 100 mg via INTRAVENOUS

## 2012-01-07 MED ORDER — FENTANYL CITRATE 0.05 MG/ML IJ SOLN
INTRAMUSCULAR | Status: DC | PRN
  Administered 2012-01-07: 100 ug via INTRAVENOUS

## 2012-01-07 MED ORDER — MORPHINE SULFATE 10 MG/ML IJ SOLN: 0.0500 mg/kg | INTRAMUSCULAR | Status: DC | PRN

## 2012-01-07 MED ORDER — PROPOFOL 10 MG/ML IV EMUL
INTRAVENOUS | Status: DC | PRN
  Administered 2012-01-07: 200 mg via INTRAVENOUS

## 2012-01-07 MED ORDER — FENTANYL CITRATE 0.05 MG/ML IJ SOLN: 50.0000 ug | INTRAMUSCULAR | Status: DC | PRN

## 2012-01-07 MED ORDER — SODIUM CHLORIDE 0.9 % IR SOLN
Status: DC | PRN
  Administered 2012-01-07: 12:00:00

## 2012-01-07 SURGICAL SUPPLY — 60 items
APL SKNCLS STERI-STRIP NONHPOA (GAUZE/BANDAGES/DRESSINGS) ×1
BAG DECANTER FOR FLEXI CONT (MISCELLANEOUS) ×2 IMPLANT
BENZOIN TINCTURE PRP APPL 2/3 (GAUZE/BANDAGES/DRESSINGS) ×2 IMPLANT
BLADE KNIFE PERSONA 10 (BLADE) ×2 IMPLANT
BLADE KNIFE PERSONA 15 (BLADE) ×2 IMPLANT
CANISTER SUCTION 1200CC (MISCELLANEOUS) ×2 IMPLANT
CLOSURE STERI STRIP 1/2 X4 (GAUZE/BANDAGES/DRESSINGS) ×1 IMPLANT
CLOTH BEACON ORANGE TIMEOUT ST (SAFETY) ×2 IMPLANT
COVER MAYO STAND STRL (DRAPES) ×2 IMPLANT
COVER TABLE BACK 60X90 (DRAPES) ×2 IMPLANT
DECANTER SPIKE VIAL GLASS SM (MISCELLANEOUS) ×2 IMPLANT
DRAIN CHANNEL 10M FLAT 3/4 FLT (DRAIN) ×2 IMPLANT
DRAIN PENROSE 1/4X12 LTX STRL (WOUND CARE) IMPLANT
DRAPE LAPAROSCOPIC ABDOMINAL (DRAPES) ×2 IMPLANT
DRSG PAD ABDOMINAL 8X10 ST (GAUZE/BANDAGES/DRESSINGS) ×3 IMPLANT
ELECT BLADE 6.5 .24CM SHAFT (ELECTRODE) ×1 IMPLANT
ELECT REM PT RETURN 9FT ADLT (ELECTROSURGICAL) ×2
ELECTRODE REM PT RTRN 9FT ADLT (ELECTROSURGICAL) ×1 IMPLANT
EVACUATOR SILICONE 100CC (DRAIN) ×2 IMPLANT
FILTER 7/8 IN (FILTER) ×2 IMPLANT
GAUZE XEROFORM 5X9 LF (GAUZE/BANDAGES/DRESSINGS) ×2 IMPLANT
GLOVE BIO SURGEON STRL SZ 6.5 (GLOVE) ×2 IMPLANT
GLOVE BIOGEL M STRL SZ7.5 (GLOVE) ×2 IMPLANT
GLOVE BIOGEL PI IND STRL 8 (GLOVE) ×1 IMPLANT
GLOVE BIOGEL PI INDICATOR 8 (GLOVE) ×1
GLOVE ECLIPSE 7.0 STRL STRAW (GLOVE) ×2 IMPLANT
GOWN PREVENTION PLUS XLARGE (GOWN DISPOSABLE) ×1 IMPLANT
GOWN PREVENTION PLUS XXLARGE (GOWN DISPOSABLE) ×3 IMPLANT
IMPLANT SALINE SMOOTH RND 325 (Breast) ×1 IMPLANT
IV NS 500ML (IV SOLUTION) ×2
IV NS 500ML BAXH (IV SOLUTION) ×4 IMPLANT
NDL HYPO 25X1 1.5 SAFETY (NEEDLE) IMPLANT
NDL SPNL 18GX3.5 QUINCKE PK (NEEDLE) IMPLANT
NEEDLE HYPO 25X1 1.5 SAFETY (NEEDLE) IMPLANT
NEEDLE SPNL 18GX3.5 QUINCKE PK (NEEDLE) ×2 IMPLANT
NS IRRIG 1000ML POUR BTL (IV SOLUTION) ×2 IMPLANT
PACK BASIN DAY SURGERY FS (CUSTOM PROCEDURE TRAY) ×2 IMPLANT
PEN SKIN MARKING BROAD TIP (MISCELLANEOUS) ×2 IMPLANT
PIN SAFETY STERILE (MISCELLANEOUS) IMPLANT
SPONGE GAUZE 4X4 12PLY (GAUZE/BANDAGES/DRESSINGS) ×2 IMPLANT
SPONGE LAP 18X18 X RAY DECT (DISPOSABLE) ×5 IMPLANT
STRIP SUTURE WOUND CLOSURE 1/2 (SUTURE) ×2 IMPLANT
SUT MNCRL AB 3-0 PS2 18 (SUTURE) ×2 IMPLANT
SUT MON AB 2-0 CT1 36 (SUTURE) ×2 IMPLANT
SUT MON AB 5-0 PS2 18 (SUTURE) IMPLANT
SUT PROLENE 3 0 PS 2 (SUTURE) IMPLANT
SYR 20CC LL (SYRINGE) ×1 IMPLANT
SYR 50ML LL SCALE MARK (SYRINGE) ×4 IMPLANT
SYR BULB IRRIGATION 50ML (SYRINGE) ×2 IMPLANT
SYR CONTROL 10ML LL (SYRINGE) ×2 IMPLANT
TAPE HYPAFIX 6X30 (GAUZE/BANDAGES/DRESSINGS) ×2 IMPLANT
TAPE MEASURE 72IN RETRACT (INSTRUMENTS)
TAPE MEASURE LINEN 72IN RETRCT (INSTRUMENTS) IMPLANT
TOWEL OR 17X24 6PK STRL BLUE (TOWEL DISPOSABLE) ×8 IMPLANT
TRAY DSU PREP LF (CUSTOM PROCEDURE TRAY) ×2 IMPLANT
TUBE CONNECTING 20X1/4 (TUBING) ×2 IMPLANT
UNDERPAD 30X30 INCONTINENT (UNDERPADS AND DIAPERS) ×4 IMPLANT
VAC PENCILS W/TUBING CLEAR (MISCELLANEOUS) ×2 IMPLANT
WATER STERILE IRR 1000ML POUR (IV SOLUTION) ×2 IMPLANT
YANKAUER SUCT BULB TIP NO VENT (SUCTIONS) ×2 IMPLANT

## 2012-01-07 NOTE — Brief Op Note (Signed)
01/07/2012  12:34 PM  PATIENT:  Carrie Ramirez  61 y.o. female  PRE-OPERATIVE DIAGNOSIS:  breast implant deflated  POST-OPERATIVE DIAGNOSIS: same  PROCEDURE:  Procedure(s): BREAST RECONSTRUCTION  SURGEON:  Surgeon(s): Rosalio Macadamia, MD       ANESTHESIA:   general  EBL:  Total I/O In: 1000 [I.V.:1000] Out: -   BLOOD ADMINISTERED:none  DRAINS: none   LOCAL MEDICATIONS USED:  XYLOCAINE 50CC  SPECIMEN:  Source of Specimen:  ruptured tissue expander  DISPOSITION OF SPECIMEN:  PATHOLOGY  COUNTS:  YES  TOURNIQUET:  * No tourniquets in log *  DICTATION: .Other Dictation: Dictation Number [id number]  PLAN OF CARE: Discharge to home after PACU  PATIENT DISPOSITION:  PACU - hemodynamically stable.   Delay start of Pharmacological VTE agent (>24hrs) due to surgical blood loss or risk of bleeding:  {YES/NO/NOT APPLICABLE:20182

## 2012-01-07 NOTE — Anesthesia Postprocedure Evaluation (Signed)
Anesthesia Post Note  Patient: Carrie Ramirez  Procedure(s) Performed:  BREAST RECONSTRUCTION - removal of right tissue expander and replacement  Anesthesia type: General  Patient location: PACU  Post pain: Pain level controlled  Post assessment: Patient's Cardiovascular Status Stable  Last Vitals:  Filed Vitals:   01/07/12 1328  BP:   Pulse: 80  Temp:   Resp: 20    Post vital signs: Reviewed and stable  Level of consciousness: alert  Complications: No apparent anesthesia complications

## 2012-01-07 NOTE — Anesthesia Procedure Notes (Signed)
Procedure Name: Intubation Date/Time: 01/07/2012 11:39 AM Performed by: Gladys Damme Pre-anesthesia Checklist: Patient identified, Emergency Drugs available, Suction available and Patient being monitored Patient Re-evaluated:Patient Re-evaluated prior to inductionOxygen Delivery Method: Simple face mask Preoxygenation: Pre-oxygenation with 100% oxygen Intubation Type: IV induction Ventilation: Mask ventilation without difficulty Laryngoscope Size: Miller and 2 Grade View: Grade I Tube type: Oral Tube size: 7.0 mm Number of attempts: 1 Placement Confirmation: breath sounds checked- equal and bilateral,  ETT inserted through vocal cords under direct vision and positive ETCO2 Secured at: 21 cm Tube secured with: Tape Dental Injury: Teeth and Oropharynx as per pre-operative assessment

## 2012-01-07 NOTE — H&P (Signed)
Carrie Ramirez is an 61 y.o. female.   Chief Complaint: Right breast cancer HPI: right breast cancer with previous tissue expander that has a leak  Past Medical History  Diagnosis Date  . Breast cancer 12/28/2011  . Hypothyroidism   . Arthritis   . Asthma   . Osteoporosis   . Hyperlipemia     Past Surgical History  Procedure Date  . Tonsillectomy   . Appendectomy 1988  . Abdominal hysterectomy 1981    part  . Breast surgery 1990    lt br bx  . Breast surgery 2011    rt mastectomy-tissue expander  . Breast reconstruction 2012    rt tissue exp replaced w implant-lt mastiplexy  . Toe fusion 2008    rt 5 toe  . Shoulder arthroscopy 2005    rt    No family history on file. Social History:  reports that she has been smoking.  She does not have any smokeless tobacco history on file. She reports that she does not drink alcohol or use illicit drugs.  Allergies:  Allergies  Allergen Reactions  . Asa Arthritis Strength-Antacid (Aspirin Buffered) Nausea Only  . Sulfa Antibiotics Nausea Only    No current facility-administered medications on file as of .   No current outpatient prescriptions on file as of .    No results found for this or any previous visit (from the past 48 hour(s)). No results found.  Review of Systems  Constitutional: Negative.   HENT: Negative.   Eyes: Negative.   Respiratory: Negative.   Cardiovascular: Negative.   Gastrointestinal: Negative.   Genitourinary: Negative.   Musculoskeletal: Negative.   Skin: Negative.   Neurological: Negative.   Endo/Heme/Allergies: Negative.   Psychiatric/Behavioral: Negative.     Height 5\' 4"  (1.626 m), weight 48.081 kg (106 lb). Physical Exam   Assessment/Plan romoval of deflatted tissue expander and replacement  Carrie Ramirez 01/07/2012, 9:46 AM

## 2012-01-07 NOTE — Anesthesia Preprocedure Evaluation (Signed)
Anesthesia Evaluation  Patient identified by MRN, date of birth, ID band Patient awake    Reviewed: Allergy & Precautions, H&P , NPO status , Patient's Chart, lab work & pertinent test results, reviewed documented beta blocker date and time   Airway Mallampati: II TM Distance: >3 FB Neck ROM: full    Dental   Pulmonary asthma ,          Cardiovascular neg cardio ROS     Neuro/Psych Negative Neurological ROS  Negative Psych ROS   GI/Hepatic negative GI ROS, Neg liver ROS,   Endo/Other  Negative Endocrine ROS  Renal/GU negative Renal ROS  Genitourinary negative   Musculoskeletal   Abdominal   Peds  Hematology negative hematology ROS (+)   Anesthesia Other Findings See surgeon's H&P   Reproductive/Obstetrics negative OB ROS                           Anesthesia Physical Anesthesia Plan  ASA: II  Anesthesia Plan: General   Post-op Pain Management:    Induction: Intravenous  Airway Management Planned: Oral ETT  Additional Equipment:   Intra-op Plan:   Post-operative Plan: Extubation in OR  Informed Consent: I have reviewed the patients History and Physical, chart, labs and discussed the procedure including the risks, benefits and alternatives for the proposed anesthesia with the patient or authorized representative who has indicated his/her understanding and acceptance.     Plan Discussed with: CRNA and Surgeon  Anesthesia Plan Comments:         Anesthesia Quick Evaluation

## 2012-01-07 NOTE — Transfer of Care (Signed)
Immediate Anesthesia Transfer of Care Note  Patient: Carrie Ramirez  Procedure(s) Performed:  BREAST RECONSTRUCTION - removal of right tissue expander and replacement  Patient Location: PACU  Anesthesia Type: General  Level of Consciousness: sedated  Airway & Oxygen Therapy: Patient Spontanous Breathing and Patient connected to face mask oxygen  Post-op Assessment: Report given to PACU RN and Post -op Vital signs reviewed and stable  Post vital signs: Reviewed and stable  Complications: No apparent anesthesia complications

## 2012-01-09 ENCOUNTER — Encounter (HOSPITAL_BASED_OUTPATIENT_CLINIC_OR_DEPARTMENT_OTHER): Payer: Self-pay | Admitting: Specialist

## 2012-01-09 NOTE — Op Note (Signed)
NAMESTELLAH, Carrie                  ACCOUNT NO.:  [id number]  MEDICAL RECORD NO.:  [id number]  LOCATION:                                 FACILITY:  PHYSICIAN:  Earvin Hansen L. Shon Hough, M.D.   DATE OF BIRTH:  DATE OF PROCEDURE:  01/07/2012 DATE OF DISCHARGE:                              OPERATIVE REPORT   A 61 year old lady with history of right breast cancer, previous right reconstruction and left lift.  She has done very very well, but on the last week or so, she has noticed that the right implant is getting smaller and tissue expander actually is deflated on examination.  PROCEDURES DONE:  Replacement of right tissue expander.  ANESTHESIA:  General.  The patient underwent general anesthesia, intubated orally.  Prep was done to the chest and breast areas in routine fashion using Hibiclens soap and solution, walled off with sterile towels and drapes, so as to make a sterile field.  Xylocaine 0.5% with epinephrine was injected locally in transverse scar.  Incision was made down to underlying pectoralis muscle.  Muscles were divided in the direction of its fibers to the underlying implant, which was deflated and was removed. Irrigation was done in the pocket.  There was no excess pathology and the space was then widened with capsulotomy's fashion superiorly, medially, and laterally.  Next, a new tissue expander mentor, smooth round spectrum tissue expander was placed into the pocket.  The muscle was then re-closed that with 2-0 Monocryl, then 250 mL of saline injected with good symmetry.  The port was placed on and put in the lateral subcutaneous plane.  Subcutaneous tissue was closed with 3-0 Monocryl.  The skin was reapproximated with running subcuticular stitch of 3-0 Monocryl.  Steri-Strips and soft dressing applied to all the areas.  She withstood the procedures very well, was taken to recovery room.  ESTIMATED BLOOD LOSS:  Less than 100 mL.  COMPLICATIONS:   None.     Yaakov Guthrie. Shon Hough, M.D.     Cathie Hoops  D:  01/07/2012  T:  01/08/2012  Job:  [id number]

## 2012-06-25 DIAGNOSIS — C50219 Malignant neoplasm of upper-inner quadrant of unspecified female breast: Secondary | ICD-10-CM | POA: Diagnosis not present

## 2012-07-07 ENCOUNTER — Other Ambulatory Visit (HOSPITAL_BASED_OUTPATIENT_CLINIC_OR_DEPARTMENT_OTHER): Payer: BC Managed Care – PPO | Admitting: Lab

## 2012-07-07 DIAGNOSIS — M8580 Other specified disorders of bone density and structure, unspecified site: Secondary | ICD-10-CM

## 2012-07-07 DIAGNOSIS — C50919 Malignant neoplasm of unspecified site of unspecified female breast: Secondary | ICD-10-CM

## 2012-07-07 DIAGNOSIS — M899 Disorder of bone, unspecified: Secondary | ICD-10-CM | POA: Diagnosis not present

## 2012-07-07 LAB — CBC WITH DIFFERENTIAL/PLATELET
BASO%: 0.9 % (ref 0.0–2.0)
Basophils Absolute: 0.1 10*3/uL (ref 0.0–0.1)
EOS%: 3.8 % (ref 0.0–7.0)
HCT: 37.6 % (ref 34.8–46.6)
HGB: 12.4 g/dL (ref 11.6–15.9)
LYMPH%: 51.3 % — ABNORMAL HIGH (ref 14.0–49.7)
MCH: 30.6 pg (ref 25.1–34.0)
MCHC: 33.1 g/dL (ref 31.5–36.0)
MCV: 92.5 fL (ref 79.5–101.0)
MONO%: 6.8 % (ref 0.0–14.0)
NEUT%: 37.2 % — ABNORMAL LOW (ref 38.4–76.8)
Platelets: 224 10*3/uL (ref 145–400)

## 2012-07-07 LAB — COMPREHENSIVE METABOLIC PANEL
AST: 21 U/L (ref 0–37)
Alkaline Phosphatase: 98 U/L (ref 39–117)
BUN: 5 mg/dL — ABNORMAL LOW (ref 6–23)
Calcium: 9.7 mg/dL (ref 8.4–10.5)
Chloride: 103 mEq/L (ref 96–112)
Creatinine, Ser: 0.69 mg/dL (ref 0.50–1.10)
Total Bilirubin: 0.4 mg/dL (ref 0.3–1.2)

## 2012-07-14 ENCOUNTER — Telehealth: Payer: Self-pay | Admitting: Oncology

## 2012-07-14 ENCOUNTER — Ambulatory Visit (HOSPITAL_BASED_OUTPATIENT_CLINIC_OR_DEPARTMENT_OTHER): Payer: BC Managed Care – PPO | Admitting: Oncology

## 2012-07-14 VITALS — BP 169/79 | HR 94 | Temp 98.2°F | Resp 20 | Ht 64.0 in | Wt 111.2 lb

## 2012-07-14 DIAGNOSIS — M949 Disorder of cartilage, unspecified: Secondary | ICD-10-CM | POA: Diagnosis not present

## 2012-07-14 DIAGNOSIS — M899 Disorder of bone, unspecified: Secondary | ICD-10-CM | POA: Diagnosis not present

## 2012-07-14 DIAGNOSIS — C50219 Malignant neoplasm of upper-inner quadrant of unspecified female breast: Secondary | ICD-10-CM | POA: Diagnosis not present

## 2012-07-14 DIAGNOSIS — C50919 Malignant neoplasm of unspecified site of unspecified female breast: Secondary | ICD-10-CM

## 2012-07-14 DIAGNOSIS — Z170 Estrogen receptor positive status [ER+]: Secondary | ICD-10-CM | POA: Diagnosis not present

## 2012-07-14 MED ORDER — LETROZOLE 2.5 MG PO TABS: 2.5000 mg | ORAL_TABLET | Freq: Every day | ORAL | Status: DC

## 2012-07-14 NOTE — Progress Notes (Signed)
ID: Carrie Ramirez   DOB: May 04, 1951  MR#: [id number]  WUJ#:[id number]  HISTORY OF PRESENT ILLNESS: Carrie Ramirez palpated a mass in her right breast on Mother's Day.  She notes that she missed her mammogram last year because her husband had some surgery and in fact the last mammogram I can find for her would be from October of 2008.  At any rate she brought the mass to Dr. Consuello Bossier attention and he set her up for bilateral diagnostic mammography performed Apr 26, 2010.  This found pleomorphic and linear classifications in the upper inner right breast, measuring up to 6.5 cm.  The posterior extent of the calcifications were outside of the field of view.  No discrete mass was seen, although on physical exam Dr. Si Gaul was able to palpate some thickening in the area in question.  Ultrasound showed an ill-defined hypoechoic areas at the corresponding portion of the breast.  There were no enlarged or abnormal appearing right axillary lymph nodes identified.  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  With this information the patient was referred to Dr. Luisa Hart and bilateral breast MRIs were obtained June 9.  This showed an area of asymmetric patchy enhancement in the right breast surrounding the clip artifact and measuring approximately 2.3 cm.  There was no abnormal enhancement in the left breast and no enlarged axillary or internal mammary lymph nodes.  Accordingly, after appropriate discussion, the patient proceeded to needle localized lumpectomy without sentinel lymph node sampling June 28, 2010.  The pathology from this procedure ([id number]) showed in addition to extensive high grade ductal carcinoma in situ, multiple foci of invasive ductal carcinoma, grade 2.  The largest focus measured 8 mm.  The anterior and superior margins were involved  with DCIS.  The distance to the invasive component was less than a millimeter anteriorly. Her subsequent history is as detailed below  INTERVAL HISTORY: The patient returns today with her husband for followup of her breast cancer. The interval history is unremarkable.  REVIEW OF SYSTEMS: She tells me the letrozole tends to constipate her, and she takes a mild laxative every night, which keeps a regular. She is not having hard bowel movements. She does have hot flashes but what I can live with dose". She is continuing to work on her reconstruction under [name] and hopes to get that finalized soon. She is behind in her mammography because of that. Just some sinus symptoms, but otherwise a detailed review of systems today was entirely negative.  PAST MEDICAL HISTORY: Past Medical History  Diagnosis Date  . Breast cancer 12/28/2011  . Hypothyroidism   . Arthritis   . Asthma   . Osteoporosis   . Hyperlipemia     PAST SURGICAL HISTORY: Past Surgical History  Procedure Date  . Tonsillectomy   . Appendectomy 1988  . Abdominal hysterectomy 1981    part  . Breast surgery 1990    lt br bx  . Breast surgery 2011    rt mastectomy-tissue expander  . Breast reconstruction 2012    rt tissue exp replaced w implant-lt mastiplexy  . Toe fusion 2008    rt 5 toe  . Shoulder arthroscopy 2005    rt  . Breast reconstruction 01/07/2012    Procedure: BREAST RECONSTRUCTION;  Surgeon: Rosalio Macadamia, MD;  Location: [facility];  Service: Plastics;  Laterality: Right;  removal of right tissue expander and replacement  History of ongoing tobacco abuse approximately half a pack per day. 2.  Remote history of peptic ulcer disease. 3.  Hypothyroidism. 4.  History of right foot surgery. 5.  History of right shoulder rotator cuff surgery under Dr. Montez Morita. 6.  Status post tonsillectomy and adenoidectomy. 7.  Status post appendectomy. 8.  Status post simple hysterectomy.  FAMILY  HISTORY The patient's father died in his early 43s from complications of diabetes.  The patient's mother died at the age of 58.  She had a diagnosis of mesothelioma.  The patient has one sister who is in good health.  There is no history of breast or ovarian cancer in the immediate family although a cousin on the patient's father side did have breast cancer.  GYNECOLOGIC HISTORY: She is Gx, P3.  As stated she had her hysterectomy in 1981.  She took Premarin after that for several years and later was on the estrogen patch   SOCIAL HISTORY: She used to work in [employer] at [employer], but is currently disabled because of nerve issues relating to her right arm.  These were only partially corrected by the rotator cuff surgery she says.  Her husband, Mersades Barbaro (goes by "[name]") is Herbalist at [employer].  The patient's daughter [name] is an associate at [employer].  Daughter [name] 54 is a Tree surgeon, son [name] is attending cooking school hoping to become a Investment banker, operational.  The patient has six grandchildren and is also raising two nephews.  She is a member of the new [employer].   ADVANCED DIRECTIVES: Not in place  HEALTH MAINTENANCE: History  Substance Use Topics  . Smoking status: Current Everyday Smoker -- 0.5 packs/day  . Smokeless tobacco: Not on file  . Alcohol Use: No     Colonoscopy:  PAP:  Bone density:  Lipid panel:  Allergies  Allergen Reactions  . Asa Arthritis Strength-Antacid (Aspirin Buffered) Nausea Only  . Sulfa Antibiotics Nausea Only    Current Outpatient Prescriptions  Medication Sig Dispense Refill  . acetaminophen (TYLENOL) 325 MG tablet Take 650 mg by mouth every 6 (six) hours as needed.      Marland Kitchen alendronate (FOSAMAX) 70 MG tablet Take 1 tablet (70 mg total) by mouth every 7 (seven) days. Take with a full glass of water on an empty stomach.  4 tablet  12  . calcium carbonate (OS-CAL) 600 MG TABS Take 600 mg by mouth 2 (two)  times daily with a meal.      . cholecalciferol (VITAMIN D) 400 UNITS TABS Take 1,000 Units by mouth daily.      Marland Kitchen letrozole (FEMARA) 2.5 MG tablet Take 1 tablet (2.5 mg total) by mouth daily.  30 tablet  12  . levothyroxine (SYNTHROID, LEVOTHROID) 112 MCG tablet Take 112 mcg by mouth daily.      . polycarbophil (FIBERCON) 625 MG tablet Take 625 mg by mouth daily.        OBJECTIVE: Middle-aged [place] American woman in no acute distress Filed Vitals:   07/14/12 1506  BP: 169/79  Pulse: 94  Temp: 98.2 F (36.8 C)  Resp: 20     Body mass index is 19.09 kg/(m^2).    ECOG FS: 1  Sclerae unicteric Oropharynx clear No cervical or supraclavicular adenopathy Lungs no rales or rhonchi Heart regular rate and rhythm Abd benign MSK no focal spinal tenderness, no peripheral edema Neuro: nonfocal Breasts: The right breast  is status post mastectomy. There is an expander in place. There is no evidence of local recurrence. The left breast is unremarkable.  LAB RESULTS: Lab Results  Component Value Date   WBC 9.4 07/07/2012   NEUTROABS 3.5 07/07/2012   HGB 12.4 07/07/2012   HCT 37.6 07/07/2012   MCV 92.5 07/07/2012   PLT 224 07/07/2012      Chemistry      Component Value Date/Time   NA 142 07/07/2012 1516   K 3.8 07/07/2012 1516   CL 103 07/07/2012 1516   CO2 29 07/07/2012 1516   BUN 5* 07/07/2012 1516   CREATININE 0.69 07/07/2012 1516      Component Value Date/Time   CALCIUM 9.7 07/07/2012 1516   ALKPHOS 98 07/07/2012 1516   AST 21 07/07/2012 1516   ALT 11 07/07/2012 1516   BILITOT 0.4 07/07/2012 1516       Lab Results  Component Value Date   LABCA2 27 07/07/2012    No components found with this basename: [id number]    No results found for this basename: INR:1;PROTIME:1 in the last 168 hours  Urinalysis    Component Value Date/Time   COLORURINE YELLOW 07/10/2007 0650   APPEARANCEUR CLEAR 07/10/2007 0650   LABSPEC 1.015 07/10/2007 0650   PHURINE 5.5 07/10/2007 0650   GLUCOSEU NEGATIVE 07/10/2007 0650    HGBUR NEGATIVE 07/10/2007 0650   BILIRUBINUR NEGATIVE 07/10/2007 0650   KETONESUR NEGATIVE 07/10/2007 0650   PROTEINUR NEGATIVE 07/10/2007 0650   UROBILINOGEN 1.0 07/10/2007 0650   NITRITE NEGATIVE 07/10/2007 0650   LEUKOCYTESUR SMALL* 07/10/2007 0650    STUDIES: No results found.  ASSESSMENT: 61 y.o. [place] woman  (1) status post right lumpectomy in July 2011 for multifocal invasive ductal carcinoma, grade 2, the largest focus measuring 8 mm. Margins were positive for ductal carcinoma in situ, invasive tumor was estrogen receptor positive at 96%, progesterone receptor positive at 93%, HER2/neu negative, with MIB-1 of 13%.  (2) Status post right modified radical mastectomy ,September 2011, showing no residual invasive disease in the breast but 2 of 17 lymph nodes involved, for a final stage of T1b N1, or stage IIA.  (3)Oncotype DX recurrence score of 18 predicts a distant recurrence at 10 years of 11% if the only adjuvant systemic therapy is tamoxifen for 5 years (4) Began on letrozole adjuvantly in October of 2011 .  (4) Also with a history of osteopenia, on Fosamax weekly.  (5) right implant reconstruction under Dr Eusebio Me   PLAN: Verlon Au going to continue the letrozole for a total of 5 years. She is due for repeat bone density January of 2014. We will continue every 6 month visits.  I should add that for some reason of even though she arrived on time today she was not brought back to our area for well over an hour. She was very patient with all this but is thinking of writing a node just to let us know the problems she had. Otherwise she knows to call for any problems that may develop before the next visit.  MAGRINAT,GUSTAV C    07/14/2012

## 2012-07-14 NOTE — Telephone Encounter (Signed)
gve the pt her feb 2014 appt calendar °

## 2012-08-27 DIAGNOSIS — C50919 Malignant neoplasm of unspecified site of unspecified female breast: Secondary | ICD-10-CM | POA: Diagnosis not present

## 2012-09-16 ENCOUNTER — Encounter (HOSPITAL_BASED_OUTPATIENT_CLINIC_OR_DEPARTMENT_OTHER): Payer: Self-pay | Admitting: *Deleted

## 2012-09-16 NOTE — Progress Notes (Signed)
This is pts 5th time here-no labs needed-had some recently cancer center

## 2012-09-18 DIAGNOSIS — C50919 Malignant neoplasm of unspecified site of unspecified female breast: Secondary | ICD-10-CM | POA: Diagnosis not present

## 2012-09-22 ENCOUNTER — Ambulatory Visit (HOSPITAL_BASED_OUTPATIENT_CLINIC_OR_DEPARTMENT_OTHER): Payer: BC Managed Care – PPO | Admitting: Certified Registered"

## 2012-09-22 ENCOUNTER — Encounter (HOSPITAL_BASED_OUTPATIENT_CLINIC_OR_DEPARTMENT_OTHER): Payer: Self-pay | Admitting: Certified Registered"

## 2012-09-22 ENCOUNTER — Ambulatory Visit (HOSPITAL_BASED_OUTPATIENT_CLINIC_OR_DEPARTMENT_OTHER)
Admission: RE | Admit: 2012-09-22 | Discharge: 2012-09-22 | Disposition: A | Discharge status: Home / Self Care | Payer: BC Managed Care – PPO | Source: Ambulatory Visit | Attending: Specialist | Admitting: Specialist

## 2012-09-22 ENCOUNTER — Encounter (HOSPITAL_BASED_OUTPATIENT_CLINIC_OR_DEPARTMENT_OTHER)
Admission: RE | Disposition: A | Discharge status: Home / Self Care | Payer: Self-pay | Source: Ambulatory Visit | Attending: Specialist

## 2012-09-22 DIAGNOSIS — C50919 Malignant neoplasm of unspecified site of unspecified female breast: Secondary | ICD-10-CM | POA: Diagnosis not present

## 2012-09-22 DIAGNOSIS — E039 Hypothyroidism, unspecified: Secondary | ICD-10-CM | POA: Diagnosis not present

## 2012-09-22 DIAGNOSIS — E785 Hyperlipidemia, unspecified: Secondary | ICD-10-CM | POA: Insufficient documentation

## 2012-09-22 DIAGNOSIS — Z4430 Encounter for fitting and adjustment of external breast prosthesis, unspecified breast: Secondary | ICD-10-CM | POA: Diagnosis not present

## 2012-09-22 DIAGNOSIS — L905 Scar conditions and fibrosis of skin: Secondary | ICD-10-CM | POA: Diagnosis not present

## 2012-09-22 DIAGNOSIS — Z853 Personal history of malignant neoplasm of breast: Secondary | ICD-10-CM | POA: Diagnosis not present

## 2012-09-22 DIAGNOSIS — J45909 Unspecified asthma, uncomplicated: Secondary | ICD-10-CM | POA: Insufficient documentation

## 2012-09-22 HISTORY — DX: Presence of dental prosthetic device (complete) (partial): Z97.2

## 2012-09-22 HISTORY — DX: Complete loss of teeth, unspecified cause, unspecified class: K08.109

## 2012-09-22 HISTORY — PX: TISSUE EXPANDER PLACEMENT: SHX2530

## 2012-09-22 HISTORY — PX: BREAST RECONSTRUCTION: SHX9

## 2012-09-22 HISTORY — DX: Presence of spectacles and contact lenses: Z97.3

## 2012-09-22 SURGERY — AEROLA/NIPPLE RECONSTRUCTION WITH GRAFT
Anesthesia: General | Site: Breast | Laterality: Right | Wound class: Clean

## 2012-09-22 MED ORDER — FENTANYL CITRATE 0.05 MG/ML IJ SOLN
INTRAMUSCULAR | Status: DC | PRN
  Administered 2012-09-22 (×2): 50 ug via INTRAVENOUS

## 2012-09-22 MED ORDER — SODIUM CHLORIDE 0.9 % IV SOLN
INTRAVENOUS | Status: DC | PRN
  Administered 2012-09-22: 10:00:00

## 2012-09-22 MED ORDER — PROPOFOL 10 MG/ML IV BOLUS
INTRAVENOUS | Status: DC | PRN
  Administered 2012-09-22: 60 mg via INTRAVENOUS
  Administered 2012-09-22: 140 mg via INTRAVENOUS

## 2012-09-22 MED ORDER — HYDROMORPHONE HCL PF 1 MG/ML IJ SOLN: 0.2500 mg | INTRAMUSCULAR | Status: DC | PRN

## 2012-09-22 MED ORDER — GLYCOPYRROLATE 0.2 MG/ML IJ SOLN
INTRAMUSCULAR | Status: DC | PRN
  Administered 2012-09-22: 0.2 mg via INTRAVENOUS

## 2012-09-22 MED ORDER — LIDOCAINE HCL (CARDIAC) 20 MG/ML IV SOLN
INTRAVENOUS | Status: DC | PRN
  Administered 2012-09-22: 60 mg via INTRAVENOUS
  Administered 2012-09-22: 40 mg via INTRAVENOUS

## 2012-09-22 MED ORDER — LACTATED RINGERS IV SOLN
INTRAVENOUS | Status: DC
  Administered 2012-09-22 (×2): via INTRAVENOUS

## 2012-09-22 MED ORDER — EPHEDRINE SULFATE 50 MG/ML IJ SOLN
INTRAMUSCULAR | Status: DC | PRN
  Administered 2012-09-22: 10 mg via INTRAVENOUS

## 2012-09-22 MED ORDER — CEFAZOLIN SODIUM-DEXTROSE 2-3 GM-% IV SOLR
2.0000 g | INTRAVENOUS | Status: AC
  Administered 2012-09-22: 2 g via INTRAVENOUS

## 2012-09-22 MED ORDER — ONDANSETRON HCL 4 MG/2ML IJ SOLN: 4.0000 mg | Freq: Once | INTRAMUSCULAR | Status: DC | PRN

## 2012-09-22 MED ORDER — OXYCODONE HCL 5 MG/5ML PO SOLN: 5.0000 mg | Freq: Once | ORAL | Status: DC | PRN

## 2012-09-22 MED ORDER — SODIUM CHLORIDE 0.9 % IV SOLN
INTRAVENOUS | Status: DC | PRN
  Administered 2012-09-22: 50 mL via INTRAMUSCULAR

## 2012-09-22 MED ORDER — OXYCODONE HCL 5 MG PO TABS: 5.0000 mg | ORAL_TABLET | Freq: Once | ORAL | Status: DC | PRN

## 2012-09-22 MED ORDER — DEXAMETHASONE SODIUM PHOSPHATE 4 MG/ML IJ SOLN
INTRAMUSCULAR | Status: DC | PRN
  Administered 2012-09-22: 10 mg via INTRAVENOUS

## 2012-09-22 MED ORDER — ONDANSETRON HCL 4 MG/2ML IJ SOLN
INTRAMUSCULAR | Status: DC | PRN
  Administered 2012-09-22: 4 mg via INTRAVENOUS

## 2012-09-22 MED ORDER — LIDOCAINE-EPINEPHRINE 0.5 %-1:200000 IJ SOLN
INTRAMUSCULAR | Status: DC | PRN
  Administered 2012-09-22: 50 mL

## 2012-09-22 MED ORDER — MIDAZOLAM HCL 5 MG/5ML IJ SOLN
INTRAMUSCULAR | Status: DC | PRN
  Administered 2012-09-22: 1 mg via INTRAVENOUS

## 2012-09-22 SURGICAL SUPPLY — 75 items
APL SKNCLS STERI-STRIP NONHPOA (GAUZE/BANDAGES/DRESSINGS) ×1
BAG DECANTER FOR FLEXI CONT (MISCELLANEOUS) ×2 IMPLANT
BALL CTTN LRG ABS STRL LF (GAUZE/BANDAGES/DRESSINGS) ×1
BENZOIN TINCTURE PRP APPL 2/3 (GAUZE/BANDAGES/DRESSINGS) ×3 IMPLANT
BLADE HEX COATED 2.75 (ELECTRODE) ×1 IMPLANT
BLADE KNIFE PERSONA 10 (BLADE) ×2 IMPLANT
BLADE KNIFE PERSONA 15 (BLADE) ×3 IMPLANT
BLADE SURG 11 STRL SS (BLADE) ×1 IMPLANT
CANISTER SUCTION 1200CC (MISCELLANEOUS) ×2 IMPLANT
CLOTH BEACON ORANGE TIMEOUT ST (SAFETY) ×2 IMPLANT
COTTONBALL LRG STERILE PKG (GAUZE/BANDAGES/DRESSINGS) ×3 IMPLANT
COVER MAYO STAND STRL (DRAPES) ×2 IMPLANT
COVER TABLE BACK 60X90 (DRAPES) ×2 IMPLANT
DECANTER SPIKE VIAL GLASS SM (MISCELLANEOUS) ×2 IMPLANT
DRAIN CHANNEL 10M FLAT 3/4 FLT (DRAIN) IMPLANT
DRAIN CHANNEL 7F 3/4 FLAT (WOUND CARE) IMPLANT
DRAPE LAPAROSCOPIC ABDOMINAL (DRAPES) ×2 IMPLANT
DRSG PAD ABDOMINAL 8X10 ST (GAUZE/BANDAGES/DRESSINGS) ×8 IMPLANT
ELECT BLADE 6.5 .24CM SHAFT (ELECTRODE) ×2 IMPLANT
ELECT REM PT RETURN 9FT ADLT (ELECTROSURGICAL) ×2
ELECTRODE REM PT RTRN 9FT ADLT (ELECTROSURGICAL) ×1 IMPLANT
EVACUATOR SILICONE 100CC (DRAIN) ×1 IMPLANT
FILTER 7/8 IN (FILTER) ×2 IMPLANT
GAUZE SPONGE 4X4 12PLY STRL LF (GAUZE/BANDAGES/DRESSINGS) IMPLANT
GAUZE XEROFORM 1X8 LF (GAUZE/BANDAGES/DRESSINGS) ×2 IMPLANT
GAUZE XEROFORM 5X9 LF (GAUZE/BANDAGES/DRESSINGS) ×2 IMPLANT
GLOVE BIO SURGEON STRL SZ 6.5 (GLOVE) ×2 IMPLANT
GLOVE BIOGEL M 7.0 STRL (GLOVE) ×1 IMPLANT
GLOVE BIOGEL M STRL SZ7.5 (GLOVE) ×1 IMPLANT
GLOVE BIOGEL PI IND STRL 7.0 (GLOVE) IMPLANT
GLOVE BIOGEL PI IND STRL 8 (GLOVE) ×1 IMPLANT
GLOVE BIOGEL PI INDICATOR 7.0 (GLOVE) ×2
GLOVE BIOGEL PI INDICATOR 8 (GLOVE)
GLOVE ECLIPSE 7.0 STRL STRAW (GLOVE) ×3 IMPLANT
GOWN PREVENTION PLUS XLARGE (GOWN DISPOSABLE) ×1 IMPLANT
GOWN PREVENTION PLUS XXLARGE (GOWN DISPOSABLE) ×2 IMPLANT
IMPL SALINE SM ROUND 250CC (Breast) IMPLANT
IMPLANT SALINE SM ROUND 250CC (Breast) ×2 IMPLANT
IV NS 500ML (IV SOLUTION) ×2
IV NS 500ML BAXH (IV SOLUTION) ×4 IMPLANT
KIT FILL SYSTEM UNIVERSAL (SET/KITS/TRAYS/PACK) ×1 IMPLANT
NDL HYPO 25X1 1.5 SAFETY (NEEDLE) ×1 IMPLANT
NDL SAFETY ECLIPSE 18X1.5 (NEEDLE) IMPLANT
NDL SPNL 18GX3.5 QUINCKE PK (NEEDLE) IMPLANT
NEEDLE HYPO 18GX1.5 SHARP (NEEDLE) ×2
NEEDLE HYPO 25X1 1.5 SAFETY (NEEDLE) ×4 IMPLANT
NEEDLE SPNL 18GX3.5 QUINCKE PK (NEEDLE) IMPLANT
NS IRRIG 1000ML POUR BTL (IV SOLUTION) ×2 IMPLANT
PACK BASIN DAY SURGERY FS (CUSTOM PROCEDURE TRAY) ×2 IMPLANT
PEN SKIN MARKING BROAD TIP (MISCELLANEOUS) ×2 IMPLANT
PIN SAFETY STERILE (MISCELLANEOUS) ×2 IMPLANT
SLEEVE SCD COMPRESS KNEE MED (MISCELLANEOUS) ×2 IMPLANT
SPECIMEN JAR MEDIUM (MISCELLANEOUS) ×2 IMPLANT
SPONGE GAUZE 4X4 12PLY (GAUZE/BANDAGES/DRESSINGS) ×2 IMPLANT
SPONGE LAP 18X18 X RAY DECT (DISPOSABLE) ×4 IMPLANT
STRIP SUTURE WOUND CLOSURE 1/2 (SUTURE) ×3 IMPLANT
SUT ETHILON 3 0 PS 1 (SUTURE) IMPLANT
SUT ETHILON 5 0 PS 2 18 (SUTURE) ×2 IMPLANT
SUT MNCRL AB 3-0 PS2 18 (SUTURE) ×5 IMPLANT
SUT MON AB 2-0 CT1 36 (SUTURE) ×2 IMPLANT
SUT PROLENE 3 0 PS 1 (SUTURE) ×2 IMPLANT
SUT PROLENE 4 0 PS 2 18 (SUTURE) ×7 IMPLANT
SYR 20CC LL (SYRINGE) ×2 IMPLANT
SYR 50ML LL SCALE MARK (SYRINGE) ×4 IMPLANT
SYR BULB IRRIGATION 50ML (SYRINGE) ×2 IMPLANT
SYR CONTROL 10ML LL (SYRINGE) ×2 IMPLANT
TAPE HYPAFIX 6X30 (GAUZE/BANDAGES/DRESSINGS) ×2 IMPLANT
TAPE MEASURE 72IN RETRACT (INSTRUMENTS) ×1
TAPE MEASURE LINEN 72IN RETRCT (INSTRUMENTS) IMPLANT
TOWEL OR 17X24 6PK STRL BLUE (TOWEL DISPOSABLE) ×8 IMPLANT
TUBE CONNECTING 20X1/4 (TUBING) ×3 IMPLANT
UNDERPAD 30X30 INCONTINENT (UNDERPADS AND DIAPERS) ×4 IMPLANT
VAC PENCILS W/TUBING CLEAR (MISCELLANEOUS) ×2 IMPLANT
WATER STERILE IRR 1000ML POUR (IV SOLUTION) ×1 IMPLANT
YANKAUER SUCT BULB TIP NO VENT (SUCTIONS) ×2 IMPLANT

## 2012-09-22 NOTE — Brief Op Note (Signed)
09/22/2012  10:27 AM  PATIENT:  Carrie Ramirez  61 y.o. female  PRE-OPERATIVE DIAGNOSIS:  right breast cancer  POST-OPERATIVE DIAGNOSIS:  Right Breast Cancer  PROCEDURE:  Procedure(s) (LRB) with comments: AEROLA/NIPPLE RECONSTRUCTION WITH GRAFT (Right) - nipple areola reconstruction on right side  TISSUE EXPANDER (Right) - tissue expander  removal right side BREAST IMPLANT EXCHANGE (Right)  SURGEON:  Surgeon(s) and Role:    * [name], MD - Primary  PHYSICIAN ASSISTANT:   ASSISTANTS: none   ANESTHESIA:   general  EBL:  Total I/O In: 1000 [I.V.:1000] Out: -   BLOOD ADMINISTERED:none  DRAINS: none   LOCAL MEDICATIONS USED:  LIDOCAINE   SPECIMEN:  Excision  DISPOSITION OF SPECIMEN:  PATHOLOGY  COUNTS:  YES  TOURNIQUET:  * No tourniquets in log *  DICTATION: .Other Dictation: Dictation Number [id number]  PLAN OF CARE: Discharge to home after PACU  PATIENT DISPOSITION:  PACU - hemodynamically stable.   Delay start of Pharmacological VTE agent (>24hrs) due to surgical blood loss or risk of bleeding: yes

## 2012-09-22 NOTE — Anesthesia Procedure Notes (Signed)
Procedure Name: LMA Insertion Date/Time: 09/22/2012 9:06 AM Performed by: Verlan Friends Pre-anesthesia Checklist: Patient identified, Emergency Drugs available, Suction available, Patient being monitored and Timeout performed Patient Re-evaluated:Patient Re-evaluated prior to inductionOxygen Delivery Method: Circle System Utilized Preoxygenation: Pre-oxygenation with 100% oxygen Intubation Type: IV induction Ventilation: Mask ventilation without difficulty LMA: LMA inserted LMA Size: 4.0 Number of attempts: 1 Airway Equipment and Method: bite block (gauze bite gard) Placement Confirmation: positive ETCO2 Tube secured with: Tape Dental Injury: Teeth and Oropharynx as per pre-operative assessment  Comments: Pt. Is edentulous

## 2012-09-22 NOTE — Anesthesia Preprocedure Evaluation (Signed)
Anesthesia Evaluation  Patient identified by MRN, date of birth, ID band Patient awake    Reviewed: Allergy & Precautions, H&P , NPO status , Patient's Chart, lab work & pertinent test results  Airway Mallampati: I TM Distance: >3 FB Neck ROM: Full    Dental  (+) Edentulous Lower, Edentulous Upper and Dental Advisory Given   Pulmonary  breath sounds clear to auscultation        Cardiovascular Rhythm:Regular Rate:Normal     Neuro/Psych    GI/Hepatic   Endo/Other    Renal/GU      Musculoskeletal   Abdominal   Peds  Hematology   Anesthesia Other Findings   Reproductive/Obstetrics                           Anesthesia Physical Anesthesia Plan  ASA: III  Anesthesia Plan: General   Post-op Pain Management:    Induction: Intravenous  Airway Management Planned: LMA  Additional Equipment:   Intra-op Plan:   Post-operative Plan: Extubation in OR  Informed Consent: I have reviewed the patients History and Physical, chart, labs and discussed the procedure including the risks, benefits and alternatives for the proposed anesthesia with the patient or authorized representative who has indicated his/her understanding and acceptance.   Dental advisory given  Plan Discussed with: CRNA, Anesthesiologist and Surgeon  Anesthesia Plan Comments:         Anesthesia Quick Evaluation

## 2012-09-22 NOTE — H&P (Signed)
Carrie Ramirez is an 61 y.o. female.   Chief Complaint: rIGHT BREAST CANCER BY HISTORY for HPI: Right nipple areolar reconstruction and right tissue expander port removal  Past Medical History  Diagnosis Date  . Breast cancer 12/28/2011  . Hypothyroidism   . Arthritis   . Asthma   . Osteoporosis   . Hyperlipemia   . Full dentures   . Wears glasses     Past Surgical History  Procedure Date  . Tonsillectomy   . Appendectomy 1988  . Abdominal hysterectomy 1981    part  . Breast surgery 1990    lt br bx  . Breast surgery 2011    rt mastectomy-tissue expander  . Breast reconstruction 2012    rt tissue exp replaced w implant-lt mastiplexy  . Toe fusion 2008    rt 5 toe  . Shoulder arthroscopy 2005    rt  . Breast reconstruction 01/07/2012    Procedure: BREAST RECONSTRUCTION;  Surgeon: Rosalio Macadamia, MD;  Location: [facility];  Service: Plastics;  Laterality: Right;  removal of right tissue expander and replacement    No family history on file. Social History:  reports that she has been smoking.  She does not have any smokeless tobacco history on file. She reports that she does not drink alcohol or use illicit drugs.  Allergies:  Allergies  Allergen Reactions  . Asa Arthritis Strength-Antacid (Aspirin Buffered) Nausea Only  . Sulfa Antibiotics Nausea Only    Medications Prior to Admission  Medication Sig Dispense Refill  . acetaminophen (TYLENOL) 325 MG tablet Take 650 mg by mouth every 6 (six) hours as needed.      Marland Kitchen alendronate (FOSAMAX) 70 MG tablet Take 1 tablet (70 mg total) by mouth every 7 (seven) days. Take with a full glass of water on an empty stomach.  4 tablet  12  . calcium carbonate (OS-CAL) 600 MG TABS Take 600 mg by mouth 2 (two) times daily with a meal.      . cholecalciferol (VITAMIN D) 400 UNITS TABS Take 1,000 Units by mouth daily.      Marland Kitchen letrozole (FEMARA) 2.5 MG tablet Take 1 tablet (2.5 mg total) by mouth daily.  90 tablet  12  .  levothyroxine (SYNTHROID, LEVOTHROID) 112 MCG tablet Take 112 mcg by mouth daily.      . polycarbophil (FIBERCON) 625 MG tablet Take 625 mg by mouth daily.      Marland Kitchen albuterol (PROVENTIL HFA;VENTOLIN HFA) 108 (90 BASE) MCG/ACT inhaler Inhale 2 puffs into the lungs every 6 (six) hours as needed.        No results found for this or any previous visit (from the past 48 hour(s)). No results found.  Review of Systems  Constitutional: Negative.   HENT: Negative.   Eyes: Negative.   Respiratory: Negative.   Cardiovascular: Negative.   Gastrointestinal: Negative.   Genitourinary: Negative.   Musculoskeletal: Positive for joint pain.  Skin: Negative.   Neurological: Negative.   Endo/Heme/Allergies: Negative.   Psychiatric/Behavioral: Negative.     Blood pressure 128/85, pulse 107, temperature 98.6 F (37 C), temperature source Oral, resp. rate 20, height 5\' 4"  (1.626 m), weight 50.349 kg (111 lb), SpO2 100.00%. Physical Exam   Assessment/Plan Right breast cancer for right nipple areolar reconstruction with tissue expander port removal  Moira Umholtz L 09/22/2012, 8:37 AM

## 2012-09-22 NOTE — Transfer of Care (Signed)
Immediate Anesthesia Transfer of Care Note  Patient: Carrie Ramirez  Procedure(s) Performed: Procedure(s) (LRB) with comments: AEROLA/NIPPLE RECONSTRUCTION WITH GRAFT (Right) - nipple areola reconstruction on right side  TISSUE EXPANDER (Right) - tissue expander  removal right side BREAST IMPLANT EXCHANGE (Right)  Patient Location: PACU  Anesthesia Type: General  Level of Consciousness: awake, alert , oriented and patient cooperative  Airway & Oxygen Therapy: Patient Spontanous Breathing and Patient connected to face mask oxygen  Post-op Assessment: Report given to PACU RN and Post -op Vital signs reviewed and stable  Post vital signs: Reviewed and stable  Complications: No apparent anesthesia complications

## 2012-09-22 NOTE — Anesthesia Postprocedure Evaluation (Signed)
  Anesthesia Post-op Note  Patient: Carrie Ramirez  Procedure(s) Performed: Procedure(s) (LRB) with comments: AEROLA/NIPPLE RECONSTRUCTION WITH GRAFT (Right) - nipple areola reconstruction on right side  TISSUE EXPANDER (Right) - tissue expander  removal right side BREAST IMPLANT EXCHANGE (Right)  Patient Location: PACU  Anesthesia Type: General  Level of Consciousness: awake, alert  and oriented  Airway and Oxygen Therapy: Patient Spontanous Breathing  Post-op Pain: none  Post-op Assessment: Post-op Vital signs reviewed  Post-op Vital Signs: Reviewed  Complications: No apparent anesthesia complications

## 2012-09-23 NOTE — Op Note (Signed)
Carrie Ramirez, Carrie Ramirez NO.:  [id number]  MEDICAL RECORD NO.:  [id number]  LOCATION:                               FACILITY:  [facility]  PHYSICIAN:  Earvin Hansen L. Shon Hough, M.D.DATE OF BIRTH:  10/13/1951  DATE OF PROCEDURE:  09/22/2012 DATE OF DISCHARGE:  09/22/2012                              OPERATIVE REPORT   A 61 year old lady with diagnosis of right breast cancer has had reconstruction on the right side with tissue expansion.  She also has had a mastopexy on the left side and everything is healing nicely.  She at first, approximately 6-8 months ago decided she did not want to have the nipple-areolar complex reconstructed, however, we did leave the port in for the tissue expander.  Now, she wants to have the nipple-areolar complex reconstructed on the right side as well as tissue expander removal port and implant for placement of permanent smooth saline implant.  PROCEDURES PLANNED:  Exchange of right tissue expander for right smooth implant saline, right nipple-areolar complex.  RECONSTRUCTION:  Preoperatively, the patient was set up and drawn for the midline of the chest __________ inframammary fold.  She had measurements from the suprasternal notch to each nipple-areolar complex as suggested by the left side, which measured approximately 17 cm down. A pattern was made and superimposed on the skin of that area.  She was then taken to the operating room, placed on the operating room table in the supine position, was given adequate general anesthesia, intubated orally.  Prep was done to the chest, breast areas, and left groin areas with Hibiclens soap and solution, walled off with sterile towels and drapes so as to make a sterile field.  The right areola was drawn and a skate flap was drawn as well, lifted and secured with bases of 4-0 Prolene.  Next, the incision was made on the left side.  The previous transverse incision carried out through skin and subcutaneous  tissue. Tissue expander and the port was removed from the field.  Suction was done with irrigation.  There was no intraoperative pathology noted underneath the pectoralis major muscle.  The area was then replaced with a Mentor saline implant, style 1600, reference #[id number], serial number is [id number] which was filled with 300 mL with excellent symmetry as compared to the left side.  The muscle and subcutaneous tissue was reclosed with 2-0 Monocryl.  Subcutaneous tissue was closed with 3-0 Monocryl in a running subcuticular stitch of 3-0 Monocryl.  Next, attention was drawn to the left groin where pattern was superimposed. An elliptical excision of full-thickness skin graft was taken.  The defect was reclosed with 2-0 Monocryl x2 __________ subcutaneously and a running subcuticular stitch of 3-0 Monocryl.  The __________ placed over the defect and secured with stay sutures of 4-0 Prolene, then a running 4-0 Prolene.  After this, #11 blade was used to open the central part of the reconstruction and the nipple complex coming back through with good prominence and good blood supply.  All wounds were cleansed.  Sterile dressings were placed with Telfa, Xeroform, wet cotton, and dry cotton and tied of securely, fluffy dressing applied to all areas, ABDs and Hypafix  tape.  She withstood all the procedures very well and was taken to recovery in excellent condition.  ESTIMATED BLOOD LOSS:  Less than 100 mL.  COMPLICATIONS:  None.     Yaakov Guthrie. Shon Hough, M.D.     Cathie Hoops  D:  09/22/2012  T:  09/22/2012  Job:  [id number]

## 2012-09-25 ENCOUNTER — Encounter (HOSPITAL_BASED_OUTPATIENT_CLINIC_OR_DEPARTMENT_OTHER): Payer: Self-pay | Admitting: Specialist

## 2013-01-09 ENCOUNTER — Other Ambulatory Visit: Payer: Self-pay | Admitting: *Deleted

## 2013-01-09 DIAGNOSIS — C50919 Malignant neoplasm of unspecified site of unspecified female breast: Secondary | ICD-10-CM

## 2013-01-12 ENCOUNTER — Telehealth: Payer: Self-pay | Admitting: Oncology

## 2013-01-12 ENCOUNTER — Other Ambulatory Visit: Payer: BC Managed Care – PPO | Admitting: Lab

## 2013-01-12 NOTE — Telephone Encounter (Signed)
Pt called to r/s lab appt to 02/14 @ 2:15.

## 2013-01-16 ENCOUNTER — Other Ambulatory Visit: Payer: BC Managed Care – PPO | Admitting: Lab

## 2013-01-19 ENCOUNTER — Ambulatory Visit (HOSPITAL_BASED_OUTPATIENT_CLINIC_OR_DEPARTMENT_OTHER): Payer: BC Managed Care – PPO | Admitting: Physician Assistant

## 2013-01-19 ENCOUNTER — Telehealth: Payer: Self-pay | Admitting: Oncology

## 2013-01-19 ENCOUNTER — Encounter: Payer: Self-pay | Admitting: Physician Assistant

## 2013-01-19 ENCOUNTER — Other Ambulatory Visit (HOSPITAL_BASED_OUTPATIENT_CLINIC_OR_DEPARTMENT_OTHER): Payer: BC Managed Care – PPO | Admitting: Lab

## 2013-01-19 VITALS — BP 157/84 | HR 108 | Temp 98.2°F | Resp 20 | Ht 64.0 in | Wt 112.6 lb

## 2013-01-19 DIAGNOSIS — M8580 Other specified disorders of bone density and structure, unspecified site: Secondary | ICD-10-CM

## 2013-01-19 DIAGNOSIS — M899 Disorder of bone, unspecified: Secondary | ICD-10-CM | POA: Diagnosis not present

## 2013-01-19 DIAGNOSIS — E039 Hypothyroidism, unspecified: Secondary | ICD-10-CM

## 2013-01-19 DIAGNOSIS — D0590 Unspecified type of carcinoma in situ of unspecified breast: Secondary | ICD-10-CM | POA: Diagnosis not present

## 2013-01-19 DIAGNOSIS — C50919 Malignant neoplasm of unspecified site of unspecified female breast: Secondary | ICD-10-CM

## 2013-01-19 DIAGNOSIS — Z170 Estrogen receptor positive status [ER+]: Secondary | ICD-10-CM

## 2013-01-19 DIAGNOSIS — Z853 Personal history of malignant neoplasm of breast: Secondary | ICD-10-CM

## 2013-01-19 HISTORY — DX: Hypothyroidism, unspecified: E03.9

## 2013-01-19 LAB — COMPREHENSIVE METABOLIC PANEL (CC13)
Albumin: 3.6 g/dL (ref 3.5–5.0)
Alkaline Phosphatase: 97 U/L (ref 40–150)
BUN: 7.9 mg/dL (ref 7.0–26.0)
Creatinine: 0.8 mg/dL (ref 0.6–1.1)
Glucose: 86 mg/dl (ref 70–99)
Potassium: 3.9 mEq/L (ref 3.5–5.1)
Total Bilirubin: 0.39 mg/dL (ref 0.20–1.20)

## 2013-01-19 LAB — CBC WITH DIFFERENTIAL/PLATELET
Basophils Absolute: 0.1 10*3/uL (ref 0.0–0.1)
EOS%: 2.4 % (ref 0.0–7.0)
Eosinophils Absolute: 0.2 10*3/uL (ref 0.0–0.5)
HGB: 12.4 g/dL (ref 11.6–15.9)
LYMPH%: 47.1 % (ref 14.0–49.7)
MCH: 31.8 pg (ref 25.1–34.0)
MCV: 93.4 fL (ref 79.5–101.0)
MONO%: 9.6 % (ref 0.0–14.0)
NEUT#: 3.5 10*3/uL (ref 1.5–6.5)
NEUT%: 40.2 % (ref 38.4–76.8)
Platelets: 198 10*3/uL (ref 145–400)

## 2013-01-19 MED ORDER — ALENDRONATE SODIUM 70 MG PO TABS: 70.0000 mg | ORAL_TABLET | ORAL | Status: DC

## 2013-01-19 MED ORDER — LETROZOLE 2.5 MG PO TABS: 2.5000 mg | ORAL_TABLET | Freq: Every day | ORAL | Status: DC

## 2013-01-19 NOTE — Progress Notes (Signed)
ID: Carrie Ramirez   DOB: 01-19-1951  MR#: [id number]  CSN#:[id number]  HISTORY OF PRESENT ILLNESS: Carrie Ramirez palpated a mass in her right breast on Mother's Day.  She notes that she missed her mammogram last year because her husband had some surgery and in fact the last mammogram I can find for her would be from October of 2008.  At any rate she brought the mass to Dr. Consuello Bossier attention and he set her up for bilateral diagnostic mammography performed Apr 26, 2010.  This found pleomorphic and linear classifications in the upper inner right breast, measuring up to 6.5 cm.  The posterior extent of the calcifications were outside of the field of view.  No discrete mass was seen, although on physical exam Dr. Si Gaul was able to palpate some thickening in the area in question.  Ultrasound showed an ill-defined hypoechoic areas at the corresponding portion of the breast.  There were no enlarged or abnormal appearing right axillary lymph nodes identified.  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  With this information the patient was referred to Dr. Luisa Hart and bilateral breast MRIs were obtained June 9.  This showed an area of asymmetric patchy enhancement in the right breast surrounding the clip artifact and measuring approximately 2.3 cm.  There was no abnormal enhancement in the left breast and no enlarged axillary or internal mammary lymph nodes.  Accordingly, after appropriate discussion, the patient proceeded to needle localized lumpectomy without sentinel lymph node sampling June 28, 2010.  The pathology from this procedure ([id number]) showed in addition to extensive high grade ductal carcinoma in situ, multiple foci of invasive ductal carcinoma, grade 2.  The largest focus measured 8 mm.  The anterior and superior margins were involved  with DCIS.  The distance to the invasive component was less than a millimeter anteriorly. Her subsequent history is as detailed below  INTERVAL HISTORY:  [name] returns today for routine 6 month followup of her right breast carcinoma. Interval history is remarkable for Carrie Ramirez having completed her reconstruction to the right breast in October 2013 under the care of Dr. Shon Hough. The surgery went well, and Carrie Ramirez is pleased with the results. She has put off her left mammogram due to the surgery, but agrees to proceed with a mammogram in March.  Carrie Ramirez continues on letrozole which she is tolerating well. She has occasional hot flashes, nothing that is particular problematic to her. She denies any joint pain. She has occasional pain in the lower back that she attributes to some arthritis. This is minor. She's had no vaginal dryness, and also denies any vaginal bleeding. She continues on Fosamax for history of osteopenia, and is slightly past due with her bone density as well.   REVIEW OF SYSTEMS:  [name] has had no recent illnesses and denies any fevers or chills. She's had no skin changes. She denies any abnormal bleeding. She's eating and drinking well ("eating like a horse") and denies any nausea or change in bowel or bladder habits. She denies any cough, shortness of breath, chest pain, or palpitations. She's had no abnormal headaches or dizziness and denies any change in vision. She also denies any unusual myalgias, arthralgias, bony pain, or peripheral swelling.  A detailed review of systems is otherwise stable and noncontributory.   PAST MEDICAL HISTORY: Past Medical History  Diagnosis Date  . Breast  cancer 12/28/2011  . Hypothyroidism   . Arthritis   . Asthma   . Osteoporosis   . Hyperlipemia   . Full dentures   . Wears glasses   . Hypothyroidism 01/19/2013    PAST SURGICAL HISTORY: Past Surgical History  Procedure Laterality Date  . Tonsillectomy    . Appendectomy  1988  .  Abdominal hysterectomy  1981    part  . Breast surgery  1990    lt br bx  . Breast surgery  2011    rt mastectomy-tissue expander  . Breast reconstruction  2012    rt tissue exp replaced w implant-lt mastiplexy  . Toe fusion  2008    rt 5 toe  . Shoulder arthroscopy  2005    rt  . Breast reconstruction  01/07/2012    Procedure: BREAST RECONSTRUCTION;  Surgeon: Rosalio Macadamia, MD;  Location: [facility];  Service: Plastics;  Laterality: Right;  removal of right tissue expander and replacement  . Breast reconstruction  09/22/2012    Procedure: AEROLA/NIPPLE RECONSTRUCTION WITH GRAFT;  Surgeon: [name], MD;  Location: [facility];  Service: Plastics;  Laterality: Right;  nipple areola reconstruction on right side   . Tissue expander placement  09/22/2012    Procedure: TISSUE EXPANDER;  Surgeon: [name], MD;  Location: [facility];  Service: Plastics;  Laterality: Right;  tissue expander  removal right side  History of ongoing tobacco abuse approximately half a pack per day. 2.  Remote history of peptic ulcer disease. 3.  Hypothyroidism. 4.  History of right foot surgery. 5.  History of right shoulder rotator cuff surgery under Dr. Montez Morita. 6.  Status post tonsillectomy and adenoidectomy. 7.  Status post appendectomy. 8.  Status post simple hysterectomy.  FAMILY HISTORY The patient's father died in his early 62s from complications of diabetes.  The patient's mother died at the age of 81.  She had a diagnosis of mesothelioma.  The patient has one sister who is in good health.  There is no history of breast or ovarian cancer in the immediate family although a cousin on the patient's father side did have breast cancer.  GYNECOLOGIC HISTORY: She is Gx, P3.  As stated she had her hysterectomy in 1981.  She took Premarin after that for several years and later was on the estrogen patch   SOCIAL HISTORY: She used to work in [employer]00 at  [employer], but is currently disabled because of nerve issues relating to her right arm.  These were only partially corrected by the rotator cuff surgery she says.  Her husband, Odeth Bry (goes by "[name]") is Herbalist at [employer].  The patient's daughter [name] is an associate at [employer].  Daughter [name] 4 is a Tree surgeon, son [name] is attending cooking school hoping to become a Investment banker, operational.  The patient has six grandchildren and is also raising two nephews.  She is a member of the new [employer].   ADVANCED DIRECTIVES: Not in place  HEALTH MAINTENANCE: History  Substance Use Topics  . Smoking status: Current Every Day Smoker -- 0.50 packs/day  . Smokeless tobacco: Not on file  . Alcohol Use: No     Colonoscopy:  PAP:  Bone density: Jan 2012, osteopenia  Lipid panel:  Allergies  Allergen Reactions  . Asa Arthritis Strength-Antacid (Aspirin Buffered) Nausea Only  . Sulfa Antibiotics Nausea Only    Current Outpatient Prescriptions  Medication Sig Dispense Refill  .  albuterol (PROVENTIL HFA;VENTOLIN HFA) 108 (90 BASE) MCG/ACT inhaler Inhale 2 puffs into the lungs every 6 (six) hours as needed.      Marland Kitchen alendronate (FOSAMAX) 70 MG tablet Take 1 tablet (70 mg total) by mouth every 7 (seven) days. Take with a full glass of water on an empty stomach.  4 tablet  12  . calcium carbonate (OS-CAL) 600 MG TABS Take 600 mg by mouth 2 (two) times daily with a meal.      . cholecalciferol (VITAMIN D) 400 UNITS TABS Take 1,000 Units by mouth daily.      Marland Kitchen letrozole (FEMARA) 2.5 MG tablet Take 1 tablet (2.5 mg total) by mouth daily.  30 tablet  12  . levothyroxine (SYNTHROID, LEVOTHROID) 112 MCG tablet Take 112 mcg by mouth daily.      . polycarbophil (FIBERCON) 625 MG tablet Take 625 mg by mouth daily.       No current facility-administered medications for this visit.    OBJECTIVE: Middle-aged [place] American woman in no acute distress Filed  Vitals:   01/19/13 1543  BP: 157/84  Pulse: 108  Temp: 98.2 F (36.8 C)  Resp: 20     Body mass index is 19.32 kg/(m^2).    ECOG FS: 0 Filed Weights   01/19/13 1543  Weight: 112 lb 9.6 oz (51.075 kg)    Sclerae unicteric Oropharynx clear No cervical or supraclavicular adenopathy Lungs clear to auscultation with no rales or rhonchi Heart regular rate and rhythm Abdomen soft, nontender, positive bowel sounds MSK no focal spinal tenderness, no peripheral edema Neuro: nonfocal, well oriented with positive affect Breasts: The right breast is status post mastectomy with implant reconstruction. No evidence of local recurrence. Left breast is unremarkable. Axillae are benign bilaterally with no palpable adenopathy.   LAB RESULTS: Lab Results  Component Value Date   WBC 8.7 01/19/2013   NEUTROABS 3.5 01/19/2013   HGB 12.4 01/19/2013   HCT 36.4 01/19/2013   MCV 93.4 01/19/2013   PLT 198 01/19/2013      Chemistry      Component Value Date/Time   NA 142 01/19/2013 1524   NA 142 07/07/2012 1516   K 3.9 01/19/2013 1524   K 3.8 07/07/2012 1516   CL 107 01/19/2013 1524   CL 103 07/07/2012 1516   CO2 28 01/19/2013 1524   CO2 29 07/07/2012 1516   BUN 7.9 01/19/2013 1524   BUN 5* 07/07/2012 1516   CREATININE 0.8 01/19/2013 1524   CREATININE 0.69 07/07/2012 1516      Component Value Date/Time   CALCIUM 9.7 01/19/2013 1524   CALCIUM 9.7 07/07/2012 1516   ALKPHOS 97 01/19/2013 1524   ALKPHOS 98 07/07/2012 1516   AST 20 01/19/2013 1524   AST 21 07/07/2012 1516   ALT 12 01/19/2013 1524   ALT 11 07/07/2012 1516   BILITOT 0.39 01/19/2013 1524   BILITOT 0.4 07/07/2012 1516       Lab Results  Component Value Date   LABCA2 27 07/07/2012   STUDIES: No results found.  Most recent bone density on 12/05/2010 showed osteopenia.  Patient is due for both a repeat bone density as well as a left mammogram.  ASSESSMENT: 62 y.o. [employer] woman  (1) status post right lumpectomy in July 2011 for multifocal invasive  ductal carcinoma, grade 2, the largest focus measuring 8 mm. Margins were positive for ductal carcinoma in situ, invasive tumor was estrogen receptor positive at 96%, progesterone receptor positive at 93%, HER2/neu negative,  with MIB-1 of 13%.  (2) Status post right modified radical mastectomy ,September 2011, showing no residual invasive disease in the breast but 2 of 17 lymph nodes involved, for a final stage of T1b N1, or stage IIA.  (3)Oncotype DX recurrence score of 18 predicts a distant recurrence at 10 years of 11% if the only adjuvant systemic therapy is tamoxifen for 5 years (4) Began on letrozole adjuvantly in October of 2011 .  (4) Also with a history of osteopenia, on Fosamax weekly.  (5) right implant reconstruction under Dr Shon Hough   PLAN:  Trinidee  continues to tolerate the letrozole well and will continue at 2.5 mg daily, our plan being to complete a total of 5 years. She is due for a repeat bone density which is being ordered, and I am also refilling her Fosamax for a history of osteopenia. She is also past due for her left mammogram which she agrees to have in late March.  We will see Raileigh back in 6 months with repeat labs and physical exam. She knows to call prior that time with any changes or problems.  Ty Oshima    01/19/2013

## 2013-01-27 ENCOUNTER — Other Ambulatory Visit: Payer: Self-pay | Admitting: Physician Assistant

## 2013-02-19 ENCOUNTER — Ambulatory Visit
Admission: RE | Admit: 2013-02-19 | Discharge: 2013-02-19 | Disposition: A | Discharge status: Home / Self Care | Payer: BC Managed Care – PPO | Source: Ambulatory Visit | Attending: Physician Assistant | Admitting: Physician Assistant

## 2013-02-19 ENCOUNTER — Other Ambulatory Visit: Payer: Self-pay | Admitting: Physician Assistant

## 2013-02-20 ENCOUNTER — Other Ambulatory Visit: Payer: BC Managed Care – PPO

## 2013-03-05 ENCOUNTER — Other Ambulatory Visit: Payer: Self-pay | Admitting: Oncology

## 2013-04-21 DIAGNOSIS — E7800 Pure hypercholesterolemia, unspecified: Secondary | ICD-10-CM | POA: Diagnosis not present

## 2013-04-21 DIAGNOSIS — E039 Hypothyroidism, unspecified: Secondary | ICD-10-CM | POA: Diagnosis not present

## 2013-04-21 DIAGNOSIS — Z79899 Other long term (current) drug therapy: Secondary | ICD-10-CM | POA: Diagnosis not present

## 2013-07-14 ENCOUNTER — Other Ambulatory Visit (HOSPITAL_BASED_OUTPATIENT_CLINIC_OR_DEPARTMENT_OTHER): Payer: BC Managed Care – PPO

## 2013-07-14 DIAGNOSIS — C50219 Malignant neoplasm of upper-inner quadrant of unspecified female breast: Secondary | ICD-10-CM

## 2013-07-14 DIAGNOSIS — M8580 Other specified disorders of bone density and structure, unspecified site: Secondary | ICD-10-CM

## 2013-07-14 DIAGNOSIS — M949 Disorder of cartilage, unspecified: Secondary | ICD-10-CM | POA: Diagnosis not present

## 2013-07-14 DIAGNOSIS — C50919 Malignant neoplasm of unspecified site of unspecified female breast: Secondary | ICD-10-CM | POA: Diagnosis not present

## 2013-07-14 LAB — COMPREHENSIVE METABOLIC PANEL (CC13)
Albumin: 3.8 g/dL (ref 3.5–5.0)
Alkaline Phosphatase: 80 U/L (ref 40–150)
BUN: 5.4 mg/dL — ABNORMAL LOW (ref 7.0–26.0)
Calcium: 9.7 mg/dL (ref 8.4–10.4)
Chloride: 109 mEq/L (ref 98–109)
Glucose: 77 mg/dl (ref 70–140)
Potassium: 3.8 mEq/L (ref 3.5–5.1)

## 2013-07-14 LAB — CBC WITH DIFFERENTIAL/PLATELET
Basophils Absolute: 0.1 10*3/uL (ref 0.0–0.1)
Eosinophils Absolute: 0.4 10*3/uL (ref 0.0–0.5)
HCT: 38.1 % (ref 34.8–46.6)
HGB: 12.8 g/dL (ref 11.6–15.9)
MCV: 94.1 fL (ref 79.5–101.0)
MONO%: 10.3 % (ref 0.0–14.0)
NEUT#: 3.6 10*3/uL (ref 1.5–6.5)
NEUT%: 45.8 % (ref 38.4–76.8)
RDW: 12.8 % (ref 11.2–14.5)

## 2013-07-21 ENCOUNTER — Telehealth: Payer: Self-pay | Admitting: Oncology

## 2013-07-21 ENCOUNTER — Ambulatory Visit (HOSPITAL_BASED_OUTPATIENT_CLINIC_OR_DEPARTMENT_OTHER): Payer: BC Managed Care – PPO | Admitting: Oncology

## 2013-07-21 VITALS — BP 177/84 | HR 118 | Temp 98.4°F | Resp 18 | Ht 64.0 in | Wt 112.2 lb

## 2013-07-21 DIAGNOSIS — Z170 Estrogen receptor positive status [ER+]: Secondary | ICD-10-CM | POA: Diagnosis not present

## 2013-07-21 DIAGNOSIS — M810 Age-related osteoporosis without current pathological fracture: Secondary | ICD-10-CM | POA: Diagnosis not present

## 2013-07-21 DIAGNOSIS — C50219 Malignant neoplasm of upper-inner quadrant of unspecified female breast: Secondary | ICD-10-CM

## 2013-07-21 DIAGNOSIS — C50919 Malignant neoplasm of unspecified site of unspecified female breast: Secondary | ICD-10-CM

## 2013-07-21 NOTE — Progress Notes (Signed)
ID: Carrie Ramirez   DOB: 1951/07/12  MR#: [id number]  CSN#:[id number]  HISTORY OF PRESENT ILLNESS: Carrie Ramirez palpated a mass in her right breast on Mother's Day.  She notes that she missed her mammogram last year because her husband had some surgery and in fact the last mammogram I can find for her would be from October of 2008.  At any rate she brought the mass to Dr. Consuello Bossier attention and he set her up for bilateral diagnostic mammography performed Apr 26, 2010.  This found pleomorphic and linear classifications in the upper inner right breast, measuring up to 6.5 cm.  The posterior extent of the calcifications were outside of the field of view.  No discrete mass was seen, although on physical exam Dr. Si Gaul was able to palpate some thickening in the area in question.  Ultrasound showed an ill-defined hypoechoic areas at the corresponding portion of the breast.  There were no enlarged or abnormal appearing right axillary lymph nodes identified.  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  With this information the patient was referred to Dr. Luisa Hart and bilateral breast MRIs were obtained June 9.  This showed an area of asymmetric patchy enhancement in the right breast surrounding the clip artifact and measuring approximately 2.3 cm.  There was no abnormal enhancement in the left breast and no enlarged axillary or internal mammary lymph nodes.  Accordingly, after appropriate discussion, the patient proceeded to needle localized lumpectomy without sentinel lymph node sampling June 28, 2010.  The pathology from this procedure ([id number]) showed in addition to extensive high grade ductal carcinoma in situ, multiple foci of invasive ductal carcinoma, grade 2.  The largest focus measured 8 mm.  The anterior and superior margins were involved  with DCIS.  The distance to the invasive component was less than a millimeter anteriorly. Her subsequent history is as detailed below  INTERVAL HISTORY:  Carrie Ramirez returns today for followup of her breast cancer. The interval history is unremarkable. She mostly stays at home, goes to church, and carries around the biggest [place] I have seen (is full candy). She is tolerating the letrozole without any significant side effects that she is aware of.  REVIEW OF SYSTEMS:  Carrie Ramirez denies unusual headaches, visual changes, gait imbalance, cough, phlegm production, pleurisy, shortness of breath, or change in bowel or bladder habits. She has some arthritis symptoms, which are not more intense or persistent than before. Her hot flashes are mild and stable. A detailed review of systems was otherwise noncontributory.  PAST MEDICAL HISTORY: Past Medical History  Diagnosis Date  . Breast cancer 12/28/2011  . Hypothyroidism   . Arthritis   . Asthma   . Osteoporosis   . Hyperlipemia   . Full dentures   . Wears glasses   . Hypothyroidism 01/19/2013    PAST SURGICAL HISTORY: Past Surgical History  Procedure Laterality Date  . Tonsillectomy    . Appendectomy  1988  . Abdominal hysterectomy  1981    part  . Breast surgery  1990    lt br bx  . Breast surgery  2011    rt mastectomy-tissue expander  . Breast reconstruction  2012    rt tissue exp replaced w implant-lt mastiplexy  . Toe fusion  2008    rt 5 toe  . Shoulder arthroscopy  2005    rt  .  Breast reconstruction  01/07/2012    Procedure: BREAST RECONSTRUCTION;  Surgeon: Rosalio Macadamia, MD;  Location: [facility];  Service: Plastics;  Laterality: Right;  removal of right tissue expander and replacement  . Breast reconstruction  09/22/2012    Procedure: AEROLA/NIPPLE RECONSTRUCTION WITH GRAFT;  Surgeon: [name], MD;  Location: [facility];  Service: Plastics;  Laterality: Right;  nipple areola reconstruction  on right side   . Tissue expander placement  09/22/2012    Procedure: TISSUE EXPANDER;  Surgeon: [name], MD;  Location: [facility];  Service: Plastics;  Laterality: Right;  tissue expander  removal right side  History of ongoing tobacco abuse approximately half a pack per day. 2.  Remote history of peptic ulcer disease. 3.  Hypothyroidism. 4.  History of right foot surgery. 5.  History of right shoulder rotator cuff surgery under Dr. Montez Morita. 6.  Status post tonsillectomy and adenoidectomy. 7.  Status post appendectomy. 8.  Status post simple hysterectomy.  FAMILY HISTORY The patient's father died in his early 70s from complications of diabetes.  The patient's mother died at the age of 53.  She had a diagnosis of mesothelioma.  The patient has one sister who is in good health.  There is no history of breast or ovarian cancer in the immediate family although a cousin on the patient's father side did have breast cancer.  GYNECOLOGIC HISTORY: She is Gx, P3.  As stated she had her hysterectomy in 1981.  She took Premarin after that for several years and later was on the estrogen patch   SOCIAL HISTORY: She used to work in [employer] at [employer], but is currently disabled because of nerve issues relating to her right arm.  These were only partially corrected by the rotator cuff surgery she says.  Her husband, Carrie Ramirez (goes by "[name]") is Herbalist at [employer].  The patient's daughter Carrie Ramirez is an associate at [employer].  Daughter Carrie Ramirez 62 is a Tree surgeon, son Carrie Ramirez is attending cooking school hoping to become a Investment banker, operational.  The patient has six grandchildren and is also raising two nephews.  She is a member of the new [employer].   ADVANCED DIRECTIVES: Not in place  HEALTH MAINTENANCE: History  Substance Use Topics  . Smoking status: Current Every Day Smoker -- 0.50 packs/day  . Smokeless tobacco: Not on file  . Alcohol  Use: No     Colonoscopy:  PAP:  Bone density: Jan 2014, osteopenia, stable  Lipid panel:  Allergies  Allergen Reactions  . Asa Arthritis Strength-Antacid [Aspirin Buffered] Nausea Only  . Sulfa Antibiotics Nausea Only    Current Outpatient Prescriptions  Medication Sig Dispense Refill  . albuterol (PROVENTIL HFA;VENTOLIN HFA) 108 (90 BASE) MCG/ACT inhaler Inhale 2 puffs into the lungs every 6 (six) hours as needed.      Marland Kitchen alendronate (FOSAMAX) 70 MG tablet Take 1 tablet (70 mg total) by mouth every 7 (seven) days. Take with a full glass of water on an empty stomach.  4 tablet  12  . calcium carbonate (OS-CAL) 600 MG TABS Take 600 mg by mouth 2 (two) times daily with a meal.      . cholecalciferol (VITAMIN D) 400 UNITS TABS Take 1,000 Units by mouth daily.      Marland Kitchen letrozole (FEMARA) 2.5 MG tablet Take 1 tablet (2.5 mg total) by mouth daily.  30 tablet  12  . levothyroxine (SYNTHROID, LEVOTHROID) 112 MCG tablet  Take 112 mcg by mouth daily.      . polycarbophil (FIBERCON) 625 MG tablet Take 625 mg by mouth daily.       No current facility-administered medications for this visit.    OBJECTIVE: Middle-aged [place] American woman who appears stated age 8 Vitals:   07/21/13 1418  BP: 177/84  Pulse: 118  Temp: 98.4 F (36.9 C)  Resp: 18     Body mass index is 19.26 kg/(m^2).    ECOG FS: 1 Filed Weights   07/21/13 1418  Weight: 112 lb 4 oz (50.916 kg)    Sclerae unicteric, pupils equal round and reactive to light Oropharynx clear No cervical or supraclavicular adenopathy Lungs clear to auscultation with no rales or rhonchi Heart regular rate and rhythm Abdomen soft, nontender, positive bowel sounds MSK no focal spinal tenderness, no peripheral edema, limited range of motion right upper extremity Neuro: nonfocal, well oriented with positive affect Breasts: The right breast is status post mastectomy with implant reconstruction. No evidence of local recurrence. The right  axilla is benign. Left breast is unremarkable.   LAB RESULTS: Lab Results  Component Value Date   WBC 7.9 07/14/2013   NEUTROABS 3.6 07/14/2013   HGB 12.8 07/14/2013   HCT 38.1 07/14/2013   MCV 94.1 07/14/2013   PLT 262 07/14/2013      Chemistry      Component Value Date/Time   NA 147* 07/14/2013 1350   NA 142 07/07/2012 1516   K 3.8 07/14/2013 1350   K 3.8 07/07/2012 1516   CL 107 01/19/2013 1524   CL 103 07/07/2012 1516   CO2 26 07/14/2013 1350   CO2 29 07/07/2012 1516   BUN 5.4* 07/14/2013 1350   BUN 5* 07/07/2012 1516   CREATININE 0.8 07/14/2013 1350   CREATININE 0.69 07/07/2012 1516      Component Value Date/Time   CALCIUM 9.7 07/14/2013 1350   CALCIUM 9.7 07/07/2012 1516   ALKPHOS 80 07/14/2013 1350   ALKPHOS 98 07/07/2012 1516   AST 17 07/14/2013 1350   AST 21 07/07/2012 1516   ALT 7 07/14/2013 1350   ALT 11 07/07/2012 1516   BILITOT 0.47 07/14/2013 1350   BILITOT 0.4 07/07/2012 1516       Lab Results  Component Value Date   LABCA2 27 07/07/2012   STUDIES: No results found.  Most recent bone density on 12/05/2010 showed osteopenia.  Patient is due for both a repeat bone density as well as a left mammogram.  ASSESSMENT: 62 y.o. [place] woman  (1) status post right lumpectomy in July 2011 for multifocal invasive ductal carcinoma, grade 2, the largest focus measuring 8 mm. Margins were positive for ductal carcinoma in situ, invasive tumor was estrogen receptor positive at 96%, progesterone receptor positive at 93%, HER2/neu negative, with MIB-1 of 13%.  (2) Status post right modified radical mastectomy ,September 2011, showing no residual invasive disease in the breast but 2 of 17 lymph nodes involved, for a final stage of T1b N1, or stage IIA.  (3) Oncotype DX recurrence score of 18 predicts a distant recurrence at 10 years of 11% if the only adjuvant systemic therapy is tamoxifen for 5 years (4) Began on letrozole adjuvantly in October of 2011 .  (4) Also with a history of osteopenia, on  Fosamax weekly.     PLAN:  Carrie Ramirez is doing very well from a breast cancer point of view, and she is tolerating her letrozole well. The plan is to continue letrozole for  a total of 5 years, which means she will finish October of 2016. She will continue on Fosamax on a laptop.  She will see [place] again in April of 2015 and then in April of 2016. She knows to call for any problems that may develop before her next visit here. Carrie Ramirez C    07/21/2013

## 2013-08-19 DIAGNOSIS — C50919 Malignant neoplasm of unspecified site of unspecified female breast: Secondary | ICD-10-CM | POA: Diagnosis not present

## 2013-12-24 DIAGNOSIS — K2100 Gastro-esophageal reflux disease with esophagitis, without bleeding: Secondary | ICD-10-CM | POA: Diagnosis not present

## 2013-12-24 DIAGNOSIS — J449 Chronic obstructive pulmonary disease, unspecified: Secondary | ICD-10-CM | POA: Diagnosis not present

## 2013-12-24 DIAGNOSIS — E7800 Pure hypercholesterolemia, unspecified: Secondary | ICD-10-CM | POA: Diagnosis not present

## 2014-01-22 ENCOUNTER — Other Ambulatory Visit: Payer: Self-pay | Admitting: Physician Assistant

## 2014-01-22 DIAGNOSIS — M949 Disorder of cartilage, unspecified: Secondary | ICD-10-CM

## 2014-01-22 DIAGNOSIS — Z853 Personal history of malignant neoplasm of breast: Secondary | ICD-10-CM

## 2014-01-22 DIAGNOSIS — M899 Disorder of bone, unspecified: Secondary | ICD-10-CM

## 2014-01-22 DIAGNOSIS — C50919 Malignant neoplasm of unspecified site of unspecified female breast: Secondary | ICD-10-CM

## 2014-01-26 ENCOUNTER — Other Ambulatory Visit: Payer: Self-pay

## 2014-01-26 DIAGNOSIS — Z1231 Encounter for screening mammogram for malignant neoplasm of breast: Secondary | ICD-10-CM

## 2014-01-26 DIAGNOSIS — Z9011 Acquired absence of right breast and nipple: Secondary | ICD-10-CM

## 2014-01-27 ENCOUNTER — Other Ambulatory Visit: Payer: Self-pay | Admitting: Physician Assistant

## 2014-01-27 DIAGNOSIS — C50919 Malignant neoplasm of unspecified site of unspecified female breast: Secondary | ICD-10-CM

## 2014-02-18 ENCOUNTER — Ambulatory Visit
Admission: RE | Admit: 2014-02-18 | Discharge: 2014-02-18 | Disposition: A | Discharge status: Home / Self Care | Payer: Medicare Other | Source: Ambulatory Visit

## 2014-02-18 ENCOUNTER — Ambulatory Visit: Payer: BC Managed Care – PPO

## 2014-02-18 ENCOUNTER — Other Ambulatory Visit: Payer: Self-pay

## 2014-02-18 DIAGNOSIS — Z1231 Encounter for screening mammogram for malignant neoplasm of breast: Secondary | ICD-10-CM

## 2014-02-18 DIAGNOSIS — Z9011 Acquired absence of right breast and nipple: Secondary | ICD-10-CM

## 2014-02-25 ENCOUNTER — Other Ambulatory Visit: Payer: Self-pay | Admitting: Oncology

## 2014-02-25 DIAGNOSIS — R928 Other abnormal and inconclusive findings on diagnostic imaging of breast: Secondary | ICD-10-CM

## 2014-03-01 ENCOUNTER — Other Ambulatory Visit: Payer: Self-pay | Admitting: Physician Assistant

## 2014-03-02 ENCOUNTER — Telehealth: Payer: Self-pay | Admitting: Oncology

## 2014-03-02 NOTE — Telephone Encounter (Signed)
lvm for pt regarding to April appt moved to May....mailed pt appt sched/avs and letter

## 2014-03-04 ENCOUNTER — Other Ambulatory Visit: Payer: Self-pay | Admitting: Oncology

## 2014-03-08 ENCOUNTER — Ambulatory Visit
Admission: RE | Admit: 2014-03-08 | Discharge: 2014-03-08 | Disposition: A | Discharge status: Home / Self Care | Payer: Medicare Other | Source: Ambulatory Visit | Attending: Oncology | Admitting: Oncology

## 2014-03-08 DIAGNOSIS — R928 Other abnormal and inconclusive findings on diagnostic imaging of breast: Secondary | ICD-10-CM

## 2014-03-08 DIAGNOSIS — Z853 Personal history of malignant neoplasm of breast: Secondary | ICD-10-CM | POA: Diagnosis not present

## 2014-03-08 DIAGNOSIS — R922 Inconclusive mammogram: Secondary | ICD-10-CM | POA: Diagnosis not present

## 2014-03-10 ENCOUNTER — Other Ambulatory Visit: Payer: Self-pay | Admitting: Oncology

## 2014-03-15 ENCOUNTER — Other Ambulatory Visit: Payer: BC Managed Care – PPO

## 2014-03-22 ENCOUNTER — Ambulatory Visit: Payer: BC Managed Care – PPO | Admitting: Physician Assistant

## 2014-04-05 ENCOUNTER — Other Ambulatory Visit: Payer: Self-pay | Admitting: Physician Assistant

## 2014-04-05 DIAGNOSIS — C50919 Malignant neoplasm of unspecified site of unspecified female breast: Secondary | ICD-10-CM

## 2014-04-21 ENCOUNTER — Other Ambulatory Visit: Payer: Self-pay | Admitting: Physician Assistant

## 2014-04-21 DIAGNOSIS — C50919 Malignant neoplasm of unspecified site of unspecified female breast: Secondary | ICD-10-CM

## 2014-04-21 DIAGNOSIS — M8580 Other specified disorders of bone density and structure, unspecified site: Secondary | ICD-10-CM

## 2014-04-21 DIAGNOSIS — E559 Vitamin D deficiency, unspecified: Secondary | ICD-10-CM

## 2014-04-22 ENCOUNTER — Other Ambulatory Visit (HOSPITAL_BASED_OUTPATIENT_CLINIC_OR_DEPARTMENT_OTHER): Payer: Medicare Other

## 2014-04-22 DIAGNOSIS — C50219 Malignant neoplasm of upper-inner quadrant of unspecified female breast: Secondary | ICD-10-CM

## 2014-04-22 DIAGNOSIS — C50919 Malignant neoplasm of unspecified site of unspecified female breast: Secondary | ICD-10-CM

## 2014-04-22 LAB — COMPREHENSIVE METABOLIC PANEL (CC13)
ALK PHOS: 84 U/L (ref 40–150)
ALT: 8 U/L (ref 0–55)
AST: 17 U/L (ref 5–34)
Albumin: 3.9 g/dL (ref 3.5–5.0)
Anion Gap: 11 mEq/L (ref 3–11)
BILIRUBIN TOTAL: 0.44 mg/dL (ref 0.20–1.20)
BUN: 8 mg/dL (ref 7.0–26.0)
CO2: 25 mEq/L (ref 22–29)
Calcium: 9.7 mg/dL (ref 8.4–10.4)
Chloride: 108 mEq/L (ref 98–109)
Creatinine: 0.8 mg/dL (ref 0.6–1.1)
Glucose: 88 mg/dl (ref 70–140)
Potassium: 4.4 mEq/L (ref 3.5–5.1)
SODIUM: 144 meq/L (ref 136–145)
TOTAL PROTEIN: 6.9 g/dL (ref 6.4–8.3)

## 2014-04-22 LAB — CBC WITH DIFFERENTIAL/PLATELET
BASO%: 1.1 % (ref 0.0–2.0)
Basophils Absolute: 0.1 10*3/uL (ref 0.0–0.1)
EOS%: 4.1 % (ref 0.0–7.0)
Eosinophils Absolute: 0.3 10*3/uL (ref 0.0–0.5)
HCT: 39.7 % (ref 34.8–46.6)
HGB: 13.1 g/dL (ref 11.6–15.9)
LYMPH%: 43.3 % (ref 14.0–49.7)
MCH: 31.4 pg (ref 25.1–34.0)
MCHC: 33.1 g/dL (ref 31.5–36.0)
MCV: 94.9 fL (ref 79.5–101.0)
MONO#: 0.7 10*3/uL (ref 0.1–0.9)
MONO%: 8.5 % (ref 0.0–14.0)
NEUT#: 3.6 10*3/uL (ref 1.5–6.5)
NEUT%: 43 % (ref 38.4–76.8)
PLATELETS: 282 10*3/uL (ref 145–400)
RBC: 4.18 10*6/uL (ref 3.70–5.45)
RDW: 13.7 % (ref 11.2–14.5)
WBC: 8.3 10*3/uL (ref 3.9–10.3)
lymph#: 3.6 10*3/uL — ABNORMAL HIGH (ref 0.9–3.3)

## 2014-04-29 ENCOUNTER — Ambulatory Visit (HOSPITAL_BASED_OUTPATIENT_CLINIC_OR_DEPARTMENT_OTHER): Payer: Medicare Other | Admitting: Physician Assistant

## 2014-04-29 ENCOUNTER — Encounter: Payer: Self-pay | Admitting: Physician Assistant

## 2014-04-29 ENCOUNTER — Telehealth: Payer: Self-pay | Admitting: Physician Assistant

## 2014-04-29 VITALS — BP 164/87 | HR 92 | Temp 98.2°F | Resp 20 | Ht 64.0 in | Wt 112.6 lb

## 2014-04-29 DIAGNOSIS — R922 Inconclusive mammogram: Secondary | ICD-10-CM | POA: Diagnosis not present

## 2014-04-29 DIAGNOSIS — Z853 Personal history of malignant neoplasm of breast: Secondary | ICD-10-CM | POA: Diagnosis not present

## 2014-04-29 DIAGNOSIS — M8580 Other specified disorders of bone density and structure, unspecified site: Secondary | ICD-10-CM

## 2014-04-29 DIAGNOSIS — Z1231 Encounter for screening mammogram for malignant neoplasm of breast: Secondary | ICD-10-CM

## 2014-04-29 DIAGNOSIS — M949 Disorder of cartilage, unspecified: Secondary | ICD-10-CM

## 2014-04-29 DIAGNOSIS — Z780 Asymptomatic menopausal state: Secondary | ICD-10-CM | POA: Diagnosis not present

## 2014-04-29 DIAGNOSIS — R9230 Dense breasts, unspecified: Secondary | ICD-10-CM

## 2014-04-29 DIAGNOSIS — C50919 Malignant neoplasm of unspecified site of unspecified female breast: Secondary | ICD-10-CM

## 2014-04-29 DIAGNOSIS — M899 Disorder of bone, unspecified: Secondary | ICD-10-CM

## 2014-04-29 MED ORDER — ALENDRONATE SODIUM 70 MG PO TABS: 70.0000 mg | ORAL_TABLET | ORAL | Status: DC

## 2014-04-29 MED ORDER — LETROZOLE 2.5 MG PO TABS: 2.5000 mg | ORAL_TABLET | Freq: Every day | ORAL | Status: DC

## 2014-04-29 NOTE — Progress Notes (Signed)
ID: Carrie Ramirez   DOB: May 07, 1951  MR#: [id number]  BOF#:[id number]   PCP:  Leola Brazil, MD GYN:   SU:  Erroll Luna, MD OTHER:  Cristine Polio, MD  CHIEF COMPLAINT:  Hx of Right Breast Cancer (letrozole)    HISTORY OF PRESENT ILLNESS: [name] palpated a mass in her right breast on Mother's Day, 2011.  She notes that she missed her mammogram the prior year because her husband had some surgery and in fact the last mammogram documented had been October of 2008.  At any rate she brought the mass to Dr. Oralia Rud attention and he set her up for bilateral diagnostic mammography performed Apr 26, 2010.  This found pleomorphic and linear classifications in the upper inner right breast, measuring up to 6.5 cm.  The posterior extent of the calcifications were outside of the field of view.  No discrete mass was seen, although on physical exam Dr. Melanee Spry was able to palpate some thickening in the area in question.  Ultrasound showed an ill-defined hypoechoic areas at the corresponding portion of the breast.  There were no enlarged or abnormal appearing right axillary lymph nodes identified.  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  With this information the patient was referred to Dr. Brantley Stage and bilateral breast MRIs were obtained June 9.  This showed an area of asymmetric patchy enhancement in the right breast surrounding the clip artifact and measuring approximately 2.3 cm.  There was no abnormal enhancement in the left breast and no enlarged axillary or internal mammary lymph nodes.  Accordingly, after appropriate discussion, the patient proceeded to needle localized lumpectomy without sentinel lymph node sampling June 28, 2010.  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  The largest focus measured 8 mm.  The anterior and superior margins were involved with DCIS.  The distance to the invasive component was less than a millimeter anteriorly.   Her subsequent history is as detailed below  INTERVAL HISTORY:  Carrie Ramirez returns alone today for followup of her right breast cancer. She continues on letrozole which she tolerates very well. In fact she tells me she has "no problems at all" today. She has some occasional hot flashes which she tells me are minimal. She has no increased joint pain. She has no problems with vaginal dryness. She does have a history of osteopenia and continues on weekly Fosamax which she also tolerates well.   Interval history is notable for the fact that Carrie Ramirez had a left screening mammogram on 02/23/2014 which showed a possible mass in the left breast. She was recalled for diagnostic left mammogram on 03/08/2014 which was then unremarkable. The mammogram did confirm dense breast tissue.   REVIEW OF SYSTEMS:  Carrie Ramirez denies any recent illnesses and has had no fevers, chills, or night sweats. She denies any skin changes and has had no abnormal bruising or bleeding. Her energy level is good, as is her appetite. She's had no problems with nausea, emesis, reflux, or change in bowel or bladder habits. She denies any cough, phlegm production, shortness of breath, peripheral swelling, chest pain, or palpitations. She's had no abnormal headaches, dizziness, or change in vision. She has some mild joint pain associated with arthritis, but no increased myalgias, arthralgias, or  bony pain.   A detailed review of systems is otherwise stable and noncontributory.    PAST MEDICAL HISTORY: Past Medical History  Diagnosis Date  . Breast cancer 12/28/2011  . Hypothyroidism   . Arthritis   . Asthma   . Osteoporosis   . Hyperlipemia   . Full dentures   . Wears glasses   .  Hypothyroidism 01/19/2013    PAST SURGICAL HISTORY: Past Surgical History  Procedure Laterality Date  . Tonsillectomy    . Appendectomy  1988  . Abdominal hysterectomy  1981    part  . Breast surgery  1990    lt br bx  . Breast surgery  2011    rt mastectomy-tissue expander  . Breast reconstruction  2012    rt tissue exp replaced w implant-lt mastiplexy  . Toe fusion  2008    rt 5 toe  . Shoulder arthroscopy  2005    rt  . Breast reconstruction  01/07/2012    Procedure: BREAST RECONSTRUCTION;  Surgeon: Hermelinda Dellen, MD;  Location: [facility];  Service: Plastics;  Laterality: Right;  removal of right tissue expander and replacement  . Breast reconstruction  09/22/2012    Procedure: AEROLA/NIPPLE RECONSTRUCTION WITH GRAFT;  Surgeon: Cristine Polio, MD;  Location: [facility];  Service: Plastics;  Laterality: Right;  nipple areola reconstruction on right side   . Tissue expander placement  09/22/2012    Procedure: TISSUE EXPANDER;  Surgeon: Cristine Polio, MD;  Location: [facility];  Service: Plastics;  Laterality: Right;  tissue expander  removal right side  History of ongoing tobacco abuse approximately half a pack per day. 2.  Remote history of peptic ulcer disease. 3.  Hypothyroidism. 4.  History of right foot surgery. 5.  History of right shoulder rotator cuff surgery under Dr. Eulas Post. 6.  Status post tonsillectomy and adenoidectomy. 7.  Status post appendectomy. 8.  Status post simple hysterectomy.  FAMILY HISTORY The patient's father died in his early 62V from complications of diabetes.  The patient's mother died at the age of 76.  She had a diagnosis of mesothelioma.  The patient has one sister who is in good health.  There is no history of breast or ovarian cancer in the immediate family although a cousin on the patient's father side did have breast cancer.  GYNECOLOGIC HISTORY:  (Reviewed 04/29/2014) She is Gx, P3.  She  had her hysterectomy in 1981.  She took Premarin after that for several years and later was on the estrogen patch.     SOCIAL HISTORY: (Reviewed 04/29/2014) She used to work in [place] at [employer], but is currently disabled because of nerve issues relating to her right arm.  These were only partially corrected by the rotator cuff surgery she says.  Her husband, Blandina Renaldo (goes by "Mateo Flow") is Presenter, broadcasting at [employer].  The patient's daughter [name] is an associate at [employer].  Daughter [name] 64 is a Insurance claims handler, son [name] is attending cooking school hoping to become a Biomedical scientist.  The patient has six grandchildren and is also raising two nephews.  She is a member of the new [employer].   ADVANCED DIRECTIVES: Not in place  HEALTH MAINTENANCE: (Updated 04/29/2014) History  Substance Use Topics  . Smoking status: Current Every Day Smoker -- 0.50 packs/day  . Smokeless tobacco: Never Used  . Alcohol Use: No     Colonoscopy: Not on file  PAP: Not on file (  status post hysterectomy)  Bone density: March 2014, osteopenia, T score -2.1, stable  Lipid panel: Not on file   Allergies  Allergen Reactions  . Asa Arthritis Strength-Antacid [Aspirin Buffered] Nausea Only  . Sulfa Antibiotics Nausea Only    Current Outpatient Prescriptions  Medication Sig Dispense Refill  . alendronate (FOSAMAX) 70 MG tablet Take 1 tablet (70 mg total) by mouth once a week. Take with a full glass of water on an empty stomach.  4 tablet  11  . calcium carbonate (OS-CAL) 600 MG TABS Take 600 mg by mouth 2 (two) times daily with a meal.      . cholecalciferol (VITAMIN D) 400 UNITS TABS Take 1,000 Units by mouth daily.      Marland Kitchen letrozole (FEMARA) 2.5 MG tablet Take 1 tablet (2.5 mg total) by mouth daily.  30 tablet  11  . levothyroxine (SYNTHROID, LEVOTHROID) 112 MCG tablet Take 112 mcg by mouth daily.      . Wheat Dextrin (BENEFIBER) POWD Take by mouth every morning.       Marland Kitchen albuterol (PROVENTIL HFA;VENTOLIN HFA) 108 (90 BASE) MCG/ACT inhaler Inhale 2 puffs into the lungs every 6 (six) hours as needed.       No current facility-administered medications for this visit.    OBJECTIVE: Middle-aged [place] American woman who appears stated age, in no acute distress Filed Vitals:   04/29/14 1451  BP: 164/87  Pulse: 92  Temp: 98.2 F (36.8 C)  Resp: 20     Body mass index is 19.32 kg/(m^2).    ECOG FS: 1 Filed Weights   04/29/14 1451  Weight: 112 lb 9.6 oz (51.075 kg)   Physical Exam: HEENT:  Sclerae anicteric.  Oropharynx clear and pink. Fair dentition. Neck supple, trachea midline.  NODES:  No cervical or supraclavicular lymphadenopathy palpated.  BREAST EXAM:  Patient is status post right mastectomy with implant reconstruction. No suspicious nodularity or skin changes noted. No evidence of local recurrence. Left breast is unremarkable. Breast tissue is dense to palpation. Axillae are benign bilaterally, no palpable lymphadenopathy. LUNGS:  Clear to auscultation bilaterally.  No wheezes or rhonchi HEART:  Regular rate and rhythm. ABDOMEN:  Soft, thin, nontender. No organomegaly or masses palpated. Positive bowel sounds.  MSK:  No focal spinal tenderness to palpation. Good range of motion in the extremities. EXTREMITIES:  No peripheral edema.  No lymphedema noted in the right upper extremity. SKIN:  No visible rashes. No excessive ecchymoses. No petechiae. No pallor. NEURO:  Nonfocal. Well oriented.  Positive affect.    LAB RESULTS: Lab Results  Component Value Date   WBC 8.3 04/22/2014   NEUTROABS 3.6 04/22/2014   HGB 13.1 04/22/2014   HCT 39.7 04/22/2014   MCV 94.9 04/22/2014   PLT 282 04/22/2014      Chemistry      Component Value Date/Time   NA 144 04/22/2014 1434   NA 142 07/07/2012 1516   K 4.4 04/22/2014 1434   K 3.8 07/07/2012 1516   CL 107 01/19/2013 1524   CL 103 07/07/2012 1516   CO2 25 04/22/2014 1434   CO2 29 07/07/2012 1516   BUN 8.0  04/22/2014 1434   BUN 5* 07/07/2012 1516   CREATININE 0.8 04/22/2014 1434   CREATININE 0.69 07/07/2012 1516      Component Value Date/Time   CALCIUM 9.7 04/22/2014 1434   CALCIUM 9.7 07/07/2012 1516   ALKPHOS 84 04/22/2014 1434   ALKPHOS 98 07/07/2012 1516   AST 17 04/22/2014  1434   AST 21 07/07/2012 1516   ALT 8 04/22/2014 1434   ALT 11 07/07/2012 1516   BILITOT 0.44 04/22/2014 1434   BILITOT 0.4 07/07/2012 1516      STUDIES:   [facility] 2014 showed osteopenia, with T score of -2.1.  MM Digital Diagnostic Unilat L 03/08/2014   CLINICAL DATA: Screening callback for questioned left breast mass.  The patient reports progressive weight loss. History of right  mastectomy for breast cancer 2011.  EXAM:  DIGITAL DIAGNOSTIC left MAMMOGRAM WITH CAD  COMPARISON: Prior exams  ACR Breast Density Category d: The breast tissue is extremely dense,  which lowers the sensitivity of mammography.  FINDINGS:  No persistent abnormality is identified in the left breast.  Asymmetric glandular parenchyma in the left upper breast is  unchanged when allowing for differences in technique and apparent  progressive weight loss.  Mammographic images were processed with CAD.  IMPRESSION:  No evidence for malignancy in the left breast.  RECOMMENDATION:  Screening mammogram in one year.(Code:SM-B-01Y)  I have discussed the findings and recommendations with the patient.  Results were also provided in writing at the conclusion of the  visit. If applicable, a reminder letter will be sent to the patient  regarding the next appointment.  BI-RADS CATEGORY 1: Negative  Electronically Signed  By: Conchita Paris M.D.  On: 03/08/2014 15:50    MM Digital Screening Unilat L 02/23/2014  CLINICAL DATA: Screening.  EXAM:  DIGITAL SCREENING UNILATERAL LEFT MAMMOGRAM WITH CAD  COMPARISON: Previous exam(s)  ACR Breast Density Category d: The breast tissue is extremely dense,  which  lowers the sensitivity of mammography.  FINDINGS:  In the left breast, a possible mass warrants further evaluation with  spot compression views and possibly ultrasound. The patient has had  a right mastectomy.  Images were processed with CAD.  IMPRESSION:  Further evaluation is suggested for possible mass in the left  breast.  RECOMMENDATION:  Diagnostic mammogram and possibly ultrasound of the left breast.  (Code:FI-L-29M)  The patient will be contacted regarding the findings, and additional  imaging will be scheduled.  BI-RADS CATEGORY 0: Incomplete. Need additional imaging evaluation  and/or prior mammograms for comparison.  Electronically Signed  By: Conchita Paris M.D.  On: 02/23/2014 13:22    ASSESSMENT: 63 y.o. [place] woman  (1) status post right lumpectomy in July 2011 for multifocal invasive ductal carcinoma, grade 2, the largest focus measuring 8 mm. Margins were positive for ductal carcinoma in situ, invasive tumor was estrogen receptor positive at 96%, progesterone receptor positive at 93%, HER2/neu negative, with MIB-1 of 13%.  (2) Status post right modified radical mastectomy ,September 2011, showing no residual invasive disease in the breast but 2 of 17 lymph nodes involved, for a final stage of T1b N1, or stage IIA.  (3) Oncotype DX recurrence score of 18 predicts a distant recurrence at 10 years of 11% if the only adjuvant systemic therapy is tamoxifen for 5 years (4) Began on letrozole adjuvantly in October of 2011. The plan  is to continue for total 5 years, until October 2016.  (4) Also with a history of osteopenia, on Fosamax weekly.     PLAN:  Terena appears to be doing very well with regards to her breast cancer. She is tolerating both the letrozole and the alendronate very well, and I am refilling both of those prescriptions for another year. Basically, we are making no change to her  current regimen.  We did discuss additional screening with a breast MRI  due to the dense tissue noted by left mammogram. At this time she declines further evaluation. She does note that if she changes her mind she can let Carrie Ramirez know, but that we would need to do the breast MRI within 3 months of a mammogram. She tells me she "may do that next year".   Otherwise, she will be due for her next left mammogram in April 2016, and we'll see Carrie Ramirez next year in May 2016 for repeat labs and physical exam. She voices both her understanding and agreement with the above plan, and knows to call with any changes or problems.   Carrie Ramirez Milda Smart PA-C    04/29/2014

## 2014-04-29 NOTE — Telephone Encounter (Signed)
per pof to sch mamm/BD-sch for pt-adv GI to call pt to sch because machine being moved-gave pt copy of sch

## 2014-08-31 DIAGNOSIS — E559 Vitamin D deficiency, unspecified: Secondary | ICD-10-CM | POA: Diagnosis not present

## 2014-08-31 DIAGNOSIS — Z79899 Other long term (current) drug therapy: Secondary | ICD-10-CM | POA: Diagnosis not present

## 2014-08-31 DIAGNOSIS — E7800 Pure hypercholesterolemia, unspecified: Secondary | ICD-10-CM | POA: Diagnosis not present

## 2014-08-31 DIAGNOSIS — J019 Acute sinusitis, unspecified: Secondary | ICD-10-CM | POA: Diagnosis not present

## 2014-08-31 DIAGNOSIS — E039 Hypothyroidism, unspecified: Secondary | ICD-10-CM | POA: Diagnosis not present

## 2014-10-20 DIAGNOSIS — C50911 Malignant neoplasm of unspecified site of right female breast: Secondary | ICD-10-CM | POA: Diagnosis not present

## 2014-11-16 DIAGNOSIS — K210 Gastro-esophageal reflux disease with esophagitis: Secondary | ICD-10-CM | POA: Diagnosis not present

## 2014-11-16 DIAGNOSIS — J449 Chronic obstructive pulmonary disease, unspecified: Secondary | ICD-10-CM | POA: Diagnosis not present

## 2014-11-16 DIAGNOSIS — E780 Pure hypercholesterolemia: Secondary | ICD-10-CM | POA: Diagnosis not present

## 2015-03-10 ENCOUNTER — Ambulatory Visit
Admission: RE | Admit: 2015-03-10 | Discharge: 2015-03-10 | Disposition: A | Discharge status: Home / Self Care | Payer: 59 | Source: Ambulatory Visit | Attending: Physician Assistant | Admitting: Physician Assistant

## 2015-03-10 ENCOUNTER — Ambulatory Visit
Admission: RE | Admit: 2015-03-10 | Discharge: 2015-03-10 | Disposition: A | Discharge status: Home / Self Care | Payer: Medicare Other | Source: Ambulatory Visit | Attending: Physician Assistant | Admitting: Physician Assistant

## 2015-03-10 DIAGNOSIS — Z1231 Encounter for screening mammogram for malignant neoplasm of breast: Secondary | ICD-10-CM

## 2015-03-10 DIAGNOSIS — M8588 Other specified disorders of bone density and structure, other site: Secondary | ICD-10-CM | POA: Diagnosis not present

## 2015-03-10 DIAGNOSIS — M85852 Other specified disorders of bone density and structure, left thigh: Secondary | ICD-10-CM | POA: Diagnosis not present

## 2015-03-10 DIAGNOSIS — Z780 Asymptomatic menopausal state: Secondary | ICD-10-CM | POA: Diagnosis not present

## 2015-03-10 DIAGNOSIS — R922 Inconclusive mammogram: Secondary | ICD-10-CM

## 2015-03-10 DIAGNOSIS — Z853 Personal history of malignant neoplasm of breast: Secondary | ICD-10-CM

## 2015-03-10 DIAGNOSIS — M8580 Other specified disorders of bone density and structure, unspecified site: Secondary | ICD-10-CM

## 2015-03-10 DIAGNOSIS — Z90710 Acquired absence of both cervix and uterus: Secondary | ICD-10-CM | POA: Diagnosis not present

## 2015-04-04 ENCOUNTER — Other Ambulatory Visit: Payer: Medicare Other

## 2015-04-18 DIAGNOSIS — E559 Vitamin D deficiency, unspecified: Secondary | ICD-10-CM | POA: Diagnosis not present

## 2015-04-18 DIAGNOSIS — J449 Chronic obstructive pulmonary disease, unspecified: Secondary | ICD-10-CM | POA: Diagnosis not present

## 2015-04-18 DIAGNOSIS — K210 Gastro-esophageal reflux disease with esophagitis: Secondary | ICD-10-CM | POA: Diagnosis not present

## 2015-04-18 DIAGNOSIS — E780 Pure hypercholesterolemia: Secondary | ICD-10-CM | POA: Diagnosis not present

## 2015-04-18 DIAGNOSIS — Z853 Personal history of malignant neoplasm of breast: Secondary | ICD-10-CM | POA: Diagnosis not present

## 2015-04-18 DIAGNOSIS — Z79899 Other long term (current) drug therapy: Secondary | ICD-10-CM | POA: Diagnosis not present

## 2015-04-20 DIAGNOSIS — C50911 Malignant neoplasm of unspecified site of right female breast: Secondary | ICD-10-CM | POA: Diagnosis not present

## 2015-04-27 ENCOUNTER — Other Ambulatory Visit: Payer: Self-pay | Admitting: *Deleted

## 2015-04-27 DIAGNOSIS — C50919 Malignant neoplasm of unspecified site of unspecified female breast: Secondary | ICD-10-CM

## 2015-04-28 ENCOUNTER — Other Ambulatory Visit (HOSPITAL_BASED_OUTPATIENT_CLINIC_OR_DEPARTMENT_OTHER): Payer: Medicare Other

## 2015-04-28 DIAGNOSIS — C50919 Malignant neoplasm of unspecified site of unspecified female breast: Secondary | ICD-10-CM

## 2015-04-28 DIAGNOSIS — Z853 Personal history of malignant neoplasm of breast: Secondary | ICD-10-CM

## 2015-04-28 LAB — CBC WITH DIFFERENTIAL/PLATELET
BASO%: 0.5 % (ref 0.0–2.0)
Basophils Absolute: 0 10*3/uL (ref 0.0–0.1)
EOS%: 2.6 % (ref 0.0–7.0)
Eosinophils Absolute: 0.2 10*3/uL (ref 0.0–0.5)
HCT: 35.4 % (ref 34.8–46.6)
HEMOGLOBIN: 11.9 g/dL (ref 11.6–15.9)
LYMPH%: 35.1 % (ref 14.0–49.7)
MCH: 31.2 pg (ref 25.1–34.0)
MCHC: 33.6 g/dL (ref 31.5–36.0)
MCV: 92.7 fL (ref 79.5–101.0)
MONO#: 0.8 10*3/uL (ref 0.1–0.9)
MONO%: 8.6 % (ref 0.0–14.0)
NEUT%: 53.2 % (ref 38.4–76.8)
NEUTROS ABS: 4.6 10*3/uL (ref 1.5–6.5)
Platelets: 281 10*3/uL (ref 145–400)
RBC: 3.82 10*6/uL (ref 3.70–5.45)
RDW: 13.7 % (ref 11.2–14.5)
WBC: 8.7 10*3/uL (ref 3.9–10.3)
lymph#: 3.1 10*3/uL (ref 0.9–3.3)

## 2015-04-28 LAB — COMPREHENSIVE METABOLIC PANEL (CC13)
ALT: 8 U/L (ref 0–55)
ANION GAP: 10 meq/L (ref 3–11)
AST: 18 U/L (ref 5–34)
Albumin: 3.6 g/dL (ref 3.5–5.0)
Alkaline Phosphatase: 75 U/L (ref 40–150)
BUN: 6.7 mg/dL — ABNORMAL LOW (ref 7.0–26.0)
CALCIUM: 9.2 mg/dL (ref 8.4–10.4)
CHLORIDE: 107 meq/L (ref 98–109)
CO2: 24 mEq/L (ref 22–29)
CREATININE: 0.8 mg/dL (ref 0.6–1.1)
EGFR: >90 mL/min/{1.73_m2} (ref >90)
GLUCOSE: 85 mg/dL (ref 70–140)
POTASSIUM: 4.3 meq/L (ref 3.5–5.1)
Sodium: 141 mEq/L (ref 136–145)
Total Bilirubin: 0.32 mg/dL (ref 0.20–1.20)
Total Protein: 6.6 g/dL (ref 6.4–8.3)

## 2015-05-05 ENCOUNTER — Ambulatory Visit (HOSPITAL_BASED_OUTPATIENT_CLINIC_OR_DEPARTMENT_OTHER): Payer: Medicare Other | Admitting: Oncology

## 2015-05-05 VITALS — BP 149/66 | HR 92 | Temp 97.9°F | Resp 18 | Ht 64.0 in | Wt 116.8 lb

## 2015-05-05 DIAGNOSIS — M8580 Other specified disorders of bone density and structure, unspecified site: Secondary | ICD-10-CM

## 2015-05-05 DIAGNOSIS — C50211 Malignant neoplasm of upper-inner quadrant of right female breast: Secondary | ICD-10-CM | POA: Diagnosis not present

## 2015-05-05 DIAGNOSIS — Z79811 Long term (current) use of aromatase inhibitors: Secondary | ICD-10-CM | POA: Diagnosis not present

## 2015-05-05 DIAGNOSIS — Z170 Estrogen receptor positive status [ER+]: Secondary | ICD-10-CM

## 2015-05-05 DIAGNOSIS — C50919 Malignant neoplasm of unspecified site of unspecified female breast: Secondary | ICD-10-CM

## 2015-05-05 MED ORDER — ALENDRONATE SODIUM 70 MG PO TABS: 70.0000 mg | ORAL_TABLET | ORAL | Status: DC

## 2015-05-05 MED ORDER — LETROZOLE 2.5 MG PO TABS: 2.5000 mg | ORAL_TABLET | Freq: Every day | ORAL | Status: DC

## 2015-05-05 NOTE — Addendum Note (Signed)
Addended by: Prentiss Bells on: 05/05/2015 04:55 PM   Modules accepted: Medications

## 2015-05-05 NOTE — Progress Notes (Signed)
ID: Carrie Ramirez   DOB: [birth year]  MR#: [id number]  EHU#:[id number]   PCP:  Leola Brazil, MD GYN:   SU:  Erroll Luna, MD OTHER:  Cristine Polio, MD  CHIEF COMPLAINT:  Hx of Right Breast Cancer   CURRENT TREATMENT: Letrozole    HISTORY OF PRESENT ILLNESS: From the original intake note:  Carrie Ramirez palpated a mass in her right breast on Mother's Day, 2011.  She notes that she missed her mammogram the prior year because her husband had some surgery and in fact the last mammogram documented had been October of 2008.  At any rate she brought the mass to Dr. Oralia Rud attention and he set her up for bilateral diagnostic mammography performed Apr 26, 2010.  This found pleomorphic and linear classifications in the upper inner right breast, measuring up to 6.5 cm.  The posterior extent of the calcifications were outside of the field of view.  No discrete mass was seen, although on physical exam Dr. Melanee Spry was able to palpate some thickening in the area in question.  Ultrasound showed an ill-defined hypoechoic areas at the corresponding portion of the breast.  There were no enlarged or abnormal appearing right axillary lymph nodes identified.  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  With this information the patient was referred to Dr. Brantley Stage and bilateral breast MRIs were obtained June 9.  This showed an area of asymmetric patchy enhancement in the right breast surrounding the clip artifact and measuring approximately 2.3 cm.  There was no abnormal enhancement in the left breast and no enlarged axillary or internal mammary lymph nodes.  Accordingly, after appropriate discussion, the patient proceeded to needle localized lumpectomy without sentinel lymph node sampling June 28, 2010.  The pathology from this procedure  ([id number]) showed in addition to extensive high grade ductal carcinoma in situ, multiple foci of invasive ductal carcinoma, grade 2.  The largest focus measured 8 mm.  The anterior and superior margins were involved with DCIS.  The distance to the invasive component was less than a millimeter anteriorly.   Her subsequent history is as detailed below  INTERVAL HISTORY:  Carrie Ramirez returns today for follow-up of her breast cancer. She continues on letrozole. She is tolerating that well. She obtains it at a very good price.  REVIEW OF SYSTEMS:  Carrie Ramirez tells me her family is doing well. She is doing a lot of church volunteering. She walks "a lot". She is unfortunately still smoking, but a pack lasts or 2 days now, she says. A detailed review of systems today was otherwise entirely negative   PAST MEDICAL HISTORY: Past Medical History  Diagnosis Date  . Breast cancer 12/28/2011  . Hypothyroidism   . Arthritis   . Asthma   . Osteoporosis   . Hyperlipemia   . Full dentures   . Wears glasses   . Hypothyroidism 01/19/2013    PAST SURGICAL HISTORY: Past Surgical History  Procedure Laterality Date  . Tonsillectomy    . Appendectomy  1988  . Abdominal hysterectomy  1981    part  . Breast surgery  1990    lt br bx  . Breast surgery  2011    rt mastectomy-tissue expander  . Breast reconstruction  2012    rt tissue exp replaced w implant-lt mastiplexy  . Toe fusion  2008  rt 5 toe  . Shoulder arthroscopy  2005    rt  . Breast reconstruction  01/07/2012    Procedure: BREAST RECONSTRUCTION;  Surgeon: Hermelinda Dellen, MD;  Location: [facility];  Service: Plastics;  Laterality: Right;  removal of right tissue expander and replacement  . Breast reconstruction  09/22/2012    Procedure: AEROLA/NIPPLE RECONSTRUCTION WITH GRAFT;  Surgeon: Cristine Polio, MD;  Location: [facility];  Service: Plastics;  Laterality: Right;  nipple areola reconstruction on right  side   . Tissue expander placement  09/22/2012    Procedure: TISSUE EXPANDER;  Surgeon: Cristine Polio, MD;  Location: [facility];  Service: Plastics;  Laterality: Right;  tissue expander  removal right side  History of ongoing tobacco abuse approximately half a pack per day. 2.  Remote history of peptic ulcer disease. 3.  Hypothyroidism. 4.  History of right foot surgery. 5.  History of right shoulder rotator cuff surgery under Dr. Eulas Post. 6.  Status post tonsillectomy and adenoidectomy. 7.  Status post appendectomy. 8.  Status post simple hysterectomy.  FAMILY HISTORY The patient's father died in his early 14H from complications of diabetes.  The patient's mother died at the age of 21.  She had a diagnosis of mesothelioma.  The patient has one sister who is in good health.  There is no history of breast or ovarian cancer in the immediate family although a cousin on the patient's father side did have breast cancer.  GYNECOLOGIC HISTORY:  (Reviewed 04/29/2014) She is Gx, P3.  She had her hysterectomy in 1981.  She took Premarin after that for several years and later was on the estrogen patch.     SOCIAL HISTORY: (Reviewed 04/29/2014) She used to work in [place] at [employer], but is currently disabled because of nerve issues relating to her right arm.  These were only partially corrected by the rotator cuff surgery she says.  Her husband, Carrie Ramirez (goes by "Mateo Flow") is Presenter, broadcasting at [employer].  The patient's daughter Carrie Ramirez is an associate at [employer].  Daughter Carrie Ramirez 36 is a Insurance claims handler, son Carrie Ramirez is attending cooking school hoping to become a Biomedical scientist.  The patient has six grandchildren and is also raising two nephews.  She is a member of the new [employer].   ADVANCED DIRECTIVES: Not in place  HEALTH MAINTENANCE: (Updated 04/29/2014) History  Substance Use Topics  . Smoking status: Current Every Day Smoker -- 0.50  packs/day  . Smokeless tobacco: Never Used  . Alcohol Use: No     Colonoscopy: Not on file  PAP: Not on file (status post hysterectomy)  Bone density: March 2014, osteopenia, T score -2.1, stable  Lipid panel: Not on file   Allergies  Allergen Reactions  . Asa Arthritis Strength-Antacid [Aspirin Buffered] Nausea Only  . Sulfa Antibiotics Nausea Only    Current Outpatient Prescriptions  Medication Sig Dispense Refill  . albuterol (PROVENTIL HFA;VENTOLIN HFA) 108 (90 BASE) MCG/ACT inhaler Inhale 2 puffs into the lungs every 6 (six) hours as needed.    Marland Kitchen alendronate (FOSAMAX) 70 MG tablet Take 1 tablet (70 mg total) by mouth once a week. Take with a full glass of water on an empty stomach. 4 tablet 11  . calcium carbonate (OS-CAL) 600 MG TABS Take 600 mg by mouth 2 (two) times daily with a meal.    . cholecalciferol (VITAMIN D) 400 UNITS TABS Take 1,000 Units by mouth daily.    Marland Kitchen  letrozole (FEMARA) 2.5 MG tablet Take 1 tablet (2.5 mg total) by mouth daily. 30 tablet 11  . levothyroxine (SYNTHROID, LEVOTHROID) 112 MCG tablet Take 112 mcg by mouth daily.    . Wheat Dextrin (BENEFIBER) POWD Take by mouth every morning.     No current facility-administered medications for this visit.    OBJECTIVE: Middle-aged [place] American woman who appears well Filed Vitals:   05/05/15 1338  BP: 149/66  Pulse: 92  Temp: 97.9 F (36.6 C)  Resp: 18     Body mass index is 20.04 kg/(m^2).    ECOG FS: 0 Filed Weights   05/05/15 1338  Weight: 116 lb 12.8 oz (52.98 kg)   Sclerae unicteric, pupils round and equal Oropharynx clear and moist-- no thrush or other lesions No cervical or supraclavicular adenopathy Lungs no rales or rhonchi Heart regular rate and rhythm Abd soft, nontender, positive bowel sounds MSK no focal spinal tenderness, no upper extremity lymphedema Neuro: nonfocal, well oriented, appropriate affect Breasts: The right breast is status post mastectomy with implant  reconstruction. There is no evidence of local recurrence. The right axilla is benign per the left breast is unremarkable.   LAB RESULTS: Lab Results  Component Value Date   WBC 8.7 04/28/2015   NEUTROABS 4.6 04/28/2015   HGB 11.9 04/28/2015   HCT 35.4 04/28/2015   MCV 92.7 04/28/2015   PLT 281 04/28/2015      Chemistry      Component Value Date/Time   NA 141 04/28/2015 1347   NA 142 07/07/2012 1516   K 4.3 04/28/2015 1347   K 3.8 07/07/2012 1516   CL 107 01/19/2013 1524   CL 103 07/07/2012 1516   CO2 24 04/28/2015 1347   CO2 29 07/07/2012 1516   BUN 6.7* 04/28/2015 1347   BUN 5* 07/07/2012 1516   CREATININE 0.8 04/28/2015 1347   CREATININE 0.69 07/07/2012 1516      Component Value Date/Time   CALCIUM 9.2 04/28/2015 1347   CALCIUM 9.7 07/07/2012 1516   ALKPHOS 75 04/28/2015 1347   ALKPHOS 98 07/07/2012 1516   AST 18 04/28/2015 1347   AST 21 07/07/2012 1516   ALT 8 04/28/2015 1347   ALT 11 07/07/2012 1516   BILITOT 0.32 04/28/2015 1347   BILITOT 0.4 07/07/2012 1516      STUDIES:  CLINICAL DATA: Screening.  EXAM: DIGITAL SCREENING UNILATERAL LEFT MAMMOGRAM WITH CAD  COMPARISON: Previous exam(s).  ACR Breast Density Category c: The breast tissue is heterogeneously dense, which may obscure small masses.  FINDINGS: The patient has had a right mastectomy. There are no findings suspicious for malignancy. Images were processed with CAD.  IMPRESSION: No mammographic evidence of malignancy. A result letter of this screening mammogram will be mailed directly to the patient.  RECOMMENDATION: Screening mammogram in one year. (Code:SM-B-01Y)  BI-RADS CATEGORY 1: Negative.   Electronically Signed  By: Claudie Revering M.D.  On: 03/11/2015 08:24  ASSESSMENT: 64 y.o. Fertile woman  (1) status post right lumpectomy in July 2011 for multifocal invasive ductal carcinoma, grade 2, the largest focus measuring 8 mm. Margins were positive for ductal  carcinoma in situ, invasive tumor was estrogen receptor positive at 96%, progesterone receptor positive at 93%, HER2/neu negative, with MIB-1 of 13%.   (2) Status post right modified radical mastectomy ,September 2011, showing no residual invasive disease in the breast but 2 of 17 lymph nodes involved, for a final stage of T1b N1, or stage IIA.   (3) Oncotype DX recurrence  score of 18 predicts a distant recurrence at 10 years of 11% if the only adjuvant systemic therapy is tamoxifen for 5 years  (4) Began on letrozole adjuvantly in October of 2011. The plan  is to continue for total 5 years, until October 2016.   (4) Also with a history of osteopenia, on Fosamax weekly.   (5) continuing tobacco abuse    PLAN:  Lady will complete her 5 years of antiestrogen therapy in September. I went ahead and refill her medication today.  Whereas with tamoxifen in many cases we continue for 10 years, with the aromatase inhibitors we generally stop at 5 years since we do not have data that tells [place] continuing beyond 5 years with these drugs is helpful (or for that matter harmful).  I did encourage her to discontinue smoking. She is doing her best to cut back. I also reminded her that she is due for a colonoscopy. She tells me she is already being contacted regarding that by Dr. Oralia Rud office.  At this point I feel comfortable releasing her to her primary care physician's care. All she will need as far as breast cancer follow-up is concerned is yearly mammography, preferably with "3-D" tomography, since she has dense breasts. She will also need a yearly physician breast exam.  Rozalynn knows that we'll be glad to see her at any point in the future if and when the need arises, but as of now we are making no further routine appointments for her here.  Chauncey Cruel, MD     05/05/2015

## 2015-06-10 ENCOUNTER — Other Ambulatory Visit: Payer: Self-pay | Admitting: *Deleted

## 2015-06-10 DIAGNOSIS — C50919 Malignant neoplasm of unspecified site of unspecified female breast: Secondary | ICD-10-CM

## 2015-06-10 MED ORDER — LETROZOLE 2.5 MG PO TABS: 2.5000 mg | ORAL_TABLET | Freq: Every day | ORAL | Status: AC

## 2015-10-11 DIAGNOSIS — E7800 Pure hypercholesterolemia, unspecified: Secondary | ICD-10-CM | POA: Diagnosis not present

## 2015-10-11 DIAGNOSIS — Z23 Encounter for immunization: Secondary | ICD-10-CM | POA: Diagnosis not present

## 2015-10-11 DIAGNOSIS — E559 Vitamin D deficiency, unspecified: Secondary | ICD-10-CM | POA: Diagnosis not present

## 2015-10-11 DIAGNOSIS — R200 Anesthesia of skin: Secondary | ICD-10-CM | POA: Diagnosis not present

## 2015-10-11 DIAGNOSIS — Z79899 Other long term (current) drug therapy: Secondary | ICD-10-CM | POA: Diagnosis not present

## 2015-10-11 DIAGNOSIS — E039 Hypothyroidism, unspecified: Secondary | ICD-10-CM | POA: Diagnosis not present

## 2015-10-11 DIAGNOSIS — Z853 Personal history of malignant neoplasm of breast: Secondary | ICD-10-CM | POA: Diagnosis not present

## 2015-10-11 DIAGNOSIS — J449 Chronic obstructive pulmonary disease, unspecified: Secondary | ICD-10-CM | POA: Diagnosis not present

## 2016-02-14 DIAGNOSIS — Z79899 Other long term (current) drug therapy: Secondary | ICD-10-CM | POA: Diagnosis not present

## 2016-02-14 DIAGNOSIS — E7800 Pure hypercholesterolemia, unspecified: Secondary | ICD-10-CM | POA: Diagnosis not present

## 2016-02-14 DIAGNOSIS — K210 Gastro-esophageal reflux disease with esophagitis: Secondary | ICD-10-CM | POA: Diagnosis not present

## 2016-02-14 DIAGNOSIS — Z853 Personal history of malignant neoplasm of breast: Secondary | ICD-10-CM | POA: Diagnosis not present

## 2016-02-14 DIAGNOSIS — J449 Chronic obstructive pulmonary disease, unspecified: Secondary | ICD-10-CM | POA: Diagnosis not present

## 2016-02-14 DIAGNOSIS — E559 Vitamin D deficiency, unspecified: Secondary | ICD-10-CM | POA: Diagnosis not present

## 2016-02-14 DIAGNOSIS — E039 Hypothyroidism, unspecified: Secondary | ICD-10-CM | POA: Diagnosis not present

## 2016-03-07 ENCOUNTER — Other Ambulatory Visit: Payer: Self-pay

## 2016-03-07 DIAGNOSIS — Z9011 Acquired absence of right breast and nipple: Secondary | ICD-10-CM

## 2016-03-07 DIAGNOSIS — Z1231 Encounter for screening mammogram for malignant neoplasm of breast: Secondary | ICD-10-CM

## 2016-03-23 ENCOUNTER — Ambulatory Visit
Admission: RE | Admit: 2016-03-23 | Discharge: 2016-03-23 | Disposition: A | Discharge status: Home / Self Care | Payer: Medicare Other | Source: Ambulatory Visit

## 2016-03-23 DIAGNOSIS — Z1231 Encounter for screening mammogram for malignant neoplasm of breast: Secondary | ICD-10-CM | POA: Diagnosis not present

## 2016-03-23 DIAGNOSIS — Z9011 Acquired absence of right breast and nipple: Secondary | ICD-10-CM

## 2016-04-26 ENCOUNTER — Other Ambulatory Visit: Payer: Self-pay

## 2016-04-26 ENCOUNTER — Telehealth: Payer: Self-pay | Admitting: *Deleted

## 2016-04-26 DIAGNOSIS — M8580 Other specified disorders of bone density and structure, unspecified site: Secondary | ICD-10-CM

## 2016-04-26 MED ORDER — ALENDRONATE SODIUM 70 MG PO TABS: 70.0000 mg | ORAL_TABLET | ORAL | Status: AC

## 2016-04-26 NOTE — Telephone Encounter (Signed)
Called patient to let her know our office received refill request from [facility] [place]. for Alendronate.  Due to her release from this office PCP will need to refill this for bone health.  No further questions.

## 2016-06-25 DIAGNOSIS — E559 Vitamin D deficiency, unspecified: Secondary | ICD-10-CM | POA: Diagnosis not present

## 2016-06-25 DIAGNOSIS — E039 Hypothyroidism, unspecified: Secondary | ICD-10-CM | POA: Diagnosis not present

## 2016-06-25 DIAGNOSIS — Z853 Personal history of malignant neoplasm of breast: Secondary | ICD-10-CM | POA: Diagnosis not present

## 2016-06-25 DIAGNOSIS — Z79899 Other long term (current) drug therapy: Secondary | ICD-10-CM | POA: Diagnosis not present

## 2016-06-25 DIAGNOSIS — J441 Chronic obstructive pulmonary disease with (acute) exacerbation: Secondary | ICD-10-CM | POA: Diagnosis not present

## 2016-06-25 DIAGNOSIS — E7800 Pure hypercholesterolemia, unspecified: Secondary | ICD-10-CM | POA: Diagnosis not present

## 2017-03-15 ENCOUNTER — Other Ambulatory Visit: Payer: Self-pay | Admitting: Oncology

## 2017-03-15 DIAGNOSIS — Z1231 Encounter for screening mammogram for malignant neoplasm of breast: Secondary | ICD-10-CM

## 2017-04-03 ENCOUNTER — Ambulatory Visit
Admission: RE | Admit: 2017-04-03 | Discharge: 2017-04-03 | Disposition: A | Discharge status: Home / Self Care | Payer: BLUE CROSS/BLUE SHIELD | Source: Ambulatory Visit | Attending: Oncology | Admitting: Oncology

## 2017-04-03 DIAGNOSIS — Z1231 Encounter for screening mammogram for malignant neoplasm of breast: Secondary | ICD-10-CM

## 2018-03-31 ENCOUNTER — Other Ambulatory Visit: Payer: Self-pay | Admitting: Oncology

## 2018-03-31 DIAGNOSIS — Z1231 Encounter for screening mammogram for malignant neoplasm of breast: Secondary | ICD-10-CM

## 2018-04-18 ENCOUNTER — Ambulatory Visit
Admission: RE | Admit: 2018-04-18 | Discharge: 2018-04-18 | Disposition: A | Discharge status: Home / Self Care | Payer: Medicare Other | Source: Ambulatory Visit | Attending: Oncology | Admitting: Oncology

## 2018-04-18 DIAGNOSIS — Z1231 Encounter for screening mammogram for malignant neoplasm of breast: Secondary | ICD-10-CM

## 2018-09-08 DIAGNOSIS — C50111 Malignant neoplasm of central portion of right female breast: Secondary | ICD-10-CM | POA: Diagnosis not present

## 2020-01-29 ENCOUNTER — Ambulatory Visit: Payer: Medicare Other | Attending: Internal Medicine

## 2020-01-29 DIAGNOSIS — Z23 Encounter for immunization: Secondary | ICD-10-CM

## 2020-01-29 NOTE — Progress Notes (Signed)
   Covid-19 Vaccination Clinic  Name:  Carrie Ramirez    MRN: [id number] DOB: September 06, 1951  01/29/2020  Carrie Ramirez was observed post Covid-19 immunization for 15 minutes without incidence. She was provided with Vaccine Information Sheet and instruction to access the V-Safe system.   Carrie Ramirez was instructed to call 911 with any severe reactions post vaccine: Marland Kitchen Difficulty breathing  . Swelling of your face and throat  . A fast heartbeat  . A bad rash all over your body  . Dizziness and weakness    Immunizations Administered    Name Date Dose VIS Date Route   Pfizer COVID-19 Vaccine 01/29/2020  1:04 PM 0.3 mL 11/13/2019 Intramuscular   Manufacturer: [place]   Lot: [id number]   [place]: [id number]

## 2020-02-23 ENCOUNTER — Ambulatory Visit: Payer: Medicare Other | Attending: Internal Medicine

## 2020-02-23 DIAGNOSIS — Z23 Encounter for immunization: Secondary | ICD-10-CM

## 2020-02-23 NOTE — Progress Notes (Signed)
   Covid-19 Vaccination Clinic  Name:  Carrie Ramirez    MRN: [id number] DOB: [birth year]  02/23/2020  Carrie Ramirez was observed post Covid-19 immunization for 15 minutes without incident. She was provided with Vaccine Information Sheet and instruction to access the V-Safe system.   Carrie Ramirez was instructed to call 911 with any severe reactions post vaccine: Marland Kitchen Difficulty breathing  . Swelling of face and throat  . A fast heartbeat  . A bad rash all over body  . Dizziness and weakness   Immunizations Administered    Name Date Dose VIS Date Route   Pfizer COVID-19 Vaccine 02/23/2020  3:59 PM 0.3 mL 11/13/2019 Intramuscular   Manufacturer: [place]   Lot: [id number]   [place]: [id number]

## 2021-03-21 DIAGNOSIS — E7800 Pure hypercholesterolemia, unspecified: Secondary | ICD-10-CM | POA: Diagnosis not present

## 2021-03-21 DIAGNOSIS — Z853 Personal history of malignant neoplasm of breast: Secondary | ICD-10-CM | POA: Diagnosis not present

## 2021-03-21 DIAGNOSIS — E559 Vitamin D deficiency, unspecified: Secondary | ICD-10-CM | POA: Diagnosis not present

## 2021-03-21 DIAGNOSIS — E039 Hypothyroidism, unspecified: Secondary | ICD-10-CM | POA: Diagnosis not present

## 2021-03-21 DIAGNOSIS — J441 Chronic obstructive pulmonary disease with (acute) exacerbation: Secondary | ICD-10-CM | POA: Diagnosis not present

## 2021-03-21 DIAGNOSIS — Z79899 Other long term (current) drug therapy: Secondary | ICD-10-CM | POA: Diagnosis not present

## 2021-03-21 DIAGNOSIS — J302 Other seasonal allergic rhinitis: Secondary | ICD-10-CM | POA: Diagnosis not present

## 2021-08-22 DIAGNOSIS — J302 Other seasonal allergic rhinitis: Secondary | ICD-10-CM | POA: Diagnosis not present

## 2021-08-22 DIAGNOSIS — Z853 Personal history of malignant neoplasm of breast: Secondary | ICD-10-CM | POA: Diagnosis not present

## 2021-08-22 DIAGNOSIS — E039 Hypothyroidism, unspecified: Secondary | ICD-10-CM | POA: Diagnosis not present

## 2021-08-22 DIAGNOSIS — E559 Vitamin D deficiency, unspecified: Secondary | ICD-10-CM | POA: Diagnosis not present

## 2021-08-22 DIAGNOSIS — J441 Chronic obstructive pulmonary disease with (acute) exacerbation: Secondary | ICD-10-CM | POA: Diagnosis not present

## 2021-08-22 DIAGNOSIS — Z79899 Other long term (current) drug therapy: Secondary | ICD-10-CM | POA: Diagnosis not present

## 2021-08-22 DIAGNOSIS — E7800 Pure hypercholesterolemia, unspecified: Secondary | ICD-10-CM | POA: Diagnosis not present
